# Patient Record
Sex: Female | Born: 1975 | Race: Black or African American | Hispanic: No | Marital: Single | Smoking: Never smoker
Health system: Southern US, Community
[De-identification: ages and names within clinical notes are randomized; demographics above are authoritative.]

## PROBLEM LIST (undated history)

## (undated) DIAGNOSIS — E538 Deficiency of other specified B group vitamins: Secondary | ICD-10-CM

## (undated) DIAGNOSIS — F419 Anxiety disorder, unspecified: Secondary | ICD-10-CM

## (undated) HISTORY — PX: DILATION AND CURETTAGE OF UTERUS: SHX78

## (undated) HISTORY — PX: DIAGNOSTIC LAPAROSCOPY: SUR761

## (undated) HISTORY — PX: INCISE AND DRAIN ABCESS: PRO64

## (undated) HISTORY — PX: TUBAL LIGATION: SHX77

---

## 1998-09-07 ENCOUNTER — Other Ambulatory Visit: Admission: RE | Admit: 1998-09-07 | Discharge: 1998-09-07 | Payer: Self-pay | Admitting: Obstetrics

## 1998-12-12 ENCOUNTER — Emergency Department (HOSPITAL_COMMUNITY): Admission: EM | Admit: 1998-12-12 | Discharge: 1998-12-12 | Payer: Self-pay | Admitting: Emergency Medicine

## 1999-02-02 ENCOUNTER — Emergency Department (HOSPITAL_COMMUNITY): Admission: EM | Admit: 1999-02-02 | Discharge: 1999-02-02 | Payer: Self-pay | Admitting: Emergency Medicine

## 2001-02-10 ENCOUNTER — Other Ambulatory Visit: Admission: RE | Admit: 2001-02-10 | Discharge: 2001-02-10 | Payer: Self-pay | Admitting: *Deleted

## 2001-07-23 ENCOUNTER — Inpatient Hospital Stay (HOSPITAL_COMMUNITY): Admission: AD | Admit: 2001-07-23 | Discharge: 2001-07-23 | Payer: Self-pay | Admitting: *Deleted

## 2001-12-14 ENCOUNTER — Emergency Department (HOSPITAL_COMMUNITY): Admission: EM | Admit: 2001-12-14 | Discharge: 2001-12-15 | Payer: Self-pay | Admitting: Emergency Medicine

## 2003-01-12 ENCOUNTER — Emergency Department (HOSPITAL_COMMUNITY): Admission: EM | Admit: 2003-01-12 | Discharge: 2003-01-12 | Payer: Self-pay | Admitting: Emergency Medicine

## 2004-03-08 ENCOUNTER — Inpatient Hospital Stay (HOSPITAL_COMMUNITY): Admission: AD | Admit: 2004-03-08 | Discharge: 2004-03-10 | Payer: Self-pay | Admitting: *Deleted

## 2004-03-08 ENCOUNTER — Encounter (INDEPENDENT_AMBULATORY_CARE_PROVIDER_SITE_OTHER): Payer: Self-pay | Admitting: Specialist

## 2004-03-11 ENCOUNTER — Inpatient Hospital Stay (HOSPITAL_COMMUNITY): Admission: AD | Admit: 2004-03-11 | Discharge: 2004-03-11 | Payer: Self-pay | Admitting: Obstetrics

## 2004-03-14 ENCOUNTER — Emergency Department (HOSPITAL_COMMUNITY): Admission: EM | Admit: 2004-03-14 | Discharge: 2004-03-14 | Payer: Self-pay | Admitting: Emergency Medicine

## 2004-06-22 ENCOUNTER — Emergency Department (HOSPITAL_COMMUNITY): Admission: EM | Admit: 2004-06-22 | Discharge: 2004-06-22 | Payer: Self-pay | Admitting: Family Medicine

## 2004-07-24 ENCOUNTER — Other Ambulatory Visit: Admission: RE | Admit: 2004-07-24 | Discharge: 2004-07-24 | Payer: Self-pay | Admitting: Obstetrics and Gynecology

## 2004-08-09 ENCOUNTER — Ambulatory Visit (HOSPITAL_COMMUNITY): Admission: RE | Admit: 2004-08-09 | Discharge: 2004-08-09 | Payer: Self-pay | Admitting: Obstetrics and Gynecology

## 2004-11-26 ENCOUNTER — Emergency Department (HOSPITAL_COMMUNITY): Admission: EM | Admit: 2004-11-26 | Discharge: 2004-11-26 | Payer: Self-pay | Admitting: Emergency Medicine

## 2004-12-02 ENCOUNTER — Emergency Department (HOSPITAL_COMMUNITY): Admission: EM | Admit: 2004-12-02 | Discharge: 2004-12-02 | Payer: Self-pay | Admitting: *Deleted

## 2005-01-08 ENCOUNTER — Emergency Department (HOSPITAL_COMMUNITY): Admission: EM | Admit: 2005-01-08 | Discharge: 2005-01-08 | Payer: Self-pay | Admitting: Emergency Medicine

## 2005-06-08 ENCOUNTER — Emergency Department (HOSPITAL_COMMUNITY): Admission: EM | Admit: 2005-06-08 | Discharge: 2005-06-08 | Payer: Self-pay | Admitting: Emergency Medicine

## 2005-09-07 ENCOUNTER — Emergency Department (HOSPITAL_COMMUNITY): Admission: EM | Admit: 2005-09-07 | Discharge: 2005-09-07 | Payer: Self-pay | Admitting: Emergency Medicine

## 2006-03-27 ENCOUNTER — Other Ambulatory Visit: Admission: RE | Admit: 2006-03-27 | Discharge: 2006-03-27 | Payer: Self-pay | Admitting: Obstetrics and Gynecology

## 2006-07-22 ENCOUNTER — Ambulatory Visit: Payer: Self-pay | Admitting: Internal Medicine

## 2007-08-26 ENCOUNTER — Encounter: Payer: Self-pay | Admitting: Internal Medicine

## 2007-10-16 ENCOUNTER — Inpatient Hospital Stay (HOSPITAL_COMMUNITY): Admission: AD | Admit: 2007-10-16 | Discharge: 2007-10-16 | Payer: Self-pay | Admitting: Obstetrics and Gynecology

## 2007-10-20 ENCOUNTER — Inpatient Hospital Stay (HOSPITAL_COMMUNITY): Admission: AD | Admit: 2007-10-20 | Discharge: 2007-10-20 | Payer: Self-pay | Admitting: Obstetrics and Gynecology

## 2007-10-22 ENCOUNTER — Inpatient Hospital Stay (HOSPITAL_COMMUNITY): Admission: AD | Admit: 2007-10-22 | Discharge: 2007-10-24 | Payer: Self-pay | Admitting: Obstetrics and Gynecology

## 2007-11-01 ENCOUNTER — Inpatient Hospital Stay (HOSPITAL_COMMUNITY): Admission: AD | Admit: 2007-11-01 | Discharge: 2007-11-01 | Payer: Self-pay | Admitting: Obstetrics and Gynecology

## 2007-11-17 ENCOUNTER — Inpatient Hospital Stay (HOSPITAL_COMMUNITY): Admission: AD | Admit: 2007-11-17 | Discharge: 2007-11-18 | Payer: Self-pay | Admitting: Obstetrics and Gynecology

## 2007-11-22 ENCOUNTER — Inpatient Hospital Stay (HOSPITAL_COMMUNITY): Admission: AD | Admit: 2007-11-22 | Discharge: 2007-11-22 | Payer: Self-pay | Admitting: Obstetrics and Gynecology

## 2007-12-14 ENCOUNTER — Inpatient Hospital Stay (HOSPITAL_COMMUNITY): Admission: AD | Admit: 2007-12-14 | Discharge: 2007-12-14 | Payer: Self-pay | Admitting: Obstetrics and Gynecology

## 2007-12-20 ENCOUNTER — Inpatient Hospital Stay (HOSPITAL_COMMUNITY): Admission: RE | Admit: 2007-12-20 | Discharge: 2007-12-23 | Payer: Self-pay | Admitting: Obstetrics and Gynecology

## 2008-06-10 ENCOUNTER — Emergency Department (HOSPITAL_COMMUNITY): Admission: EM | Admit: 2008-06-10 | Discharge: 2008-06-10 | Payer: Self-pay | Admitting: Emergency Medicine

## 2008-07-04 ENCOUNTER — Emergency Department (HOSPITAL_COMMUNITY): Admission: EM | Admit: 2008-07-04 | Discharge: 2008-07-04 | Payer: Self-pay | Admitting: Emergency Medicine

## 2008-12-14 ENCOUNTER — Ambulatory Visit: Payer: Self-pay | Admitting: Internal Medicine

## 2008-12-14 DIAGNOSIS — R300 Dysuria: Secondary | ICD-10-CM

## 2008-12-14 DIAGNOSIS — N760 Acute vaginitis: Secondary | ICD-10-CM

## 2008-12-14 HISTORY — DX: Acute vaginitis: N76.0

## 2008-12-14 HISTORY — DX: Dysuria: R30.0

## 2008-12-14 LAB — CONVERTED CEMR LAB
Bilirubin Urine: NEGATIVE
Chlamydia, DNA Probe: NEGATIVE
Crystals: NEGATIVE
GC Probe Amp, Genital: NEGATIVE
HCV Ab: NEGATIVE
Hep A IgM: NEGATIVE
Hep B C IgM: NEGATIVE
Hepatitis B Surface Ag: NEGATIVE
Ketones, ur: NEGATIVE mg/dL
Nitrite: NEGATIVE
Specific Gravity, Urine: 1.025 (ref 1.000–1.03)
Total Protein, Urine: NEGATIVE mg/dL
Urine Glucose: NEGATIVE mg/dL
Urobilinogen, UA: 0.2 (ref 0.0–1.0)
pH: 6 (ref 5.0–8.0)

## 2008-12-26 ENCOUNTER — Emergency Department (HOSPITAL_COMMUNITY): Admission: EM | Admit: 2008-12-26 | Discharge: 2008-12-26 | Payer: Self-pay | Admitting: Emergency Medicine

## 2009-03-07 ENCOUNTER — Telehealth: Payer: Self-pay | Admitting: Internal Medicine

## 2009-03-22 ENCOUNTER — Encounter: Payer: Self-pay | Admitting: Internal Medicine

## 2009-03-23 ENCOUNTER — Encounter: Payer: Self-pay | Admitting: Internal Medicine

## 2009-04-10 ENCOUNTER — Telehealth: Payer: Self-pay | Admitting: Internal Medicine

## 2009-04-24 ENCOUNTER — Ambulatory Visit: Payer: Self-pay | Admitting: Diagnostic Radiology

## 2009-04-24 ENCOUNTER — Emergency Department (HOSPITAL_BASED_OUTPATIENT_CLINIC_OR_DEPARTMENT_OTHER): Admission: EM | Admit: 2009-04-24 | Discharge: 2009-04-24 | Payer: Self-pay | Admitting: Emergency Medicine

## 2010-01-11 ENCOUNTER — Ambulatory Visit: Payer: Self-pay | Admitting: Internal Medicine

## 2010-01-11 DIAGNOSIS — R4586 Emotional lability: Secondary | ICD-10-CM | POA: Insufficient documentation

## 2010-01-11 DIAGNOSIS — F411 Generalized anxiety disorder: Secondary | ICD-10-CM | POA: Insufficient documentation

## 2010-01-11 DIAGNOSIS — J069 Acute upper respiratory infection, unspecified: Secondary | ICD-10-CM

## 2010-01-11 HISTORY — DX: Acute upper respiratory infection, unspecified: J06.9

## 2010-01-11 HISTORY — DX: Emotional lability: R45.86

## 2010-03-01 ENCOUNTER — Ambulatory Visit: Payer: Self-pay | Admitting: Internal Medicine

## 2010-03-01 DIAGNOSIS — K5909 Other constipation: Secondary | ICD-10-CM

## 2010-03-01 DIAGNOSIS — K921 Melena: Secondary | ICD-10-CM | POA: Insufficient documentation

## 2010-03-01 HISTORY — DX: Other constipation: K59.09

## 2010-03-01 HISTORY — DX: Melena: K92.1

## 2010-03-11 ENCOUNTER — Telehealth: Payer: Self-pay | Admitting: Internal Medicine

## 2010-10-22 ENCOUNTER — Emergency Department (HOSPITAL_BASED_OUTPATIENT_CLINIC_OR_DEPARTMENT_OTHER): Admission: EM | Admit: 2010-10-22 | Discharge: 2010-10-22 | Payer: Self-pay | Admitting: Emergency Medicine

## 2010-10-24 ENCOUNTER — Emergency Department (HOSPITAL_COMMUNITY): Admission: EM | Admit: 2010-10-24 | Discharge: 2010-10-24 | Payer: Self-pay | Admitting: Emergency Medicine

## 2010-10-28 ENCOUNTER — Telehealth: Payer: Self-pay | Admitting: Internal Medicine

## 2011-01-07 NOTE — Assessment & Plan Note (Signed)
Summary: blood in stool x1wk/offered sooner appt/pt requested certain ...   Vital Signs:  Patient profile:   35 year old female Weight:      156 pounds Temp:     98.2 degrees F oral Pulse rate:   75 / minute BP sitting:   102 / 64  (left arm)  Vitals Entered By: Tora Perches (March 01, 2010 4:12 PM) CC: blood in stool Is Patient Diabetic? No   CC:  blood in stool.  History of Present Illness: C/o conspation C/o bleeding x episode after a hard stool  Preventive Screening-Counseling & Management  Alcohol-Tobacco     Smoking Status: never  Current Medications (verified): 1)  Birth Control Pills 2)  Xanax 0.5 Mg Tabs (Alprazolam) .... 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    Physical Exam  General:  NAD Mouth:  WNL Lungs:  CTA Heart:  RRR Abdomen:  soft and non-tender.   Rectal:  No external abnormalities noted. Normal sphincter tone. No rectal masses or tenderness.   Impression & Recommendations:  Problem # 1:  HEMATOCHEZIA (ICD-578.1) Assessment New Procedure: Anoscopy Indication: Rectal bleeding Risks and benefits were explained. The pt. was placed in the L decubitus position. Digital rectal exam revealed no masses. Anoscope was introduced w/o difficulties. Upon withdrawl, a carefull look at the mucosa was obtained. At 6 o'clock a    6    mm int. inflamed hemorroid was present without active bleeding. Impression: No anal fissure. Inflamed internal hemorrhoid. Disposition: see A&P.  Tolerated well. Complications: none.  Problem # 2:  CONSTIPATION, CHRONIC (ICD-564.09) Assessment: New  See "Patient Instructions".  Her updated medication list for  this problem includes:    Miralax Powd (Polyethylene glycol 3350) .Marland Kitchen... 1 scoop once daily as needed for constipation  Orders: Anoscopy (60454)  Complete Medication List: 1)  Birth Control Pills  2)  Xanax 0.5 Mg Tabs (Alprazolam) .... Once daily 3)  Loratadine-pseudoephedrine 10-240 Mg Xr24h-tab (Loratadine-pseudoephedrine) .Marland Kitchen.. 1 by mouth once daily as needed allergies 4)  Optivar 0.05 % Soln (Azelastine hcl) .Marland Kitchen.. 1 gtt each eye two times a day for allergies 5)  Amoxicillin 500 Mg Caps (Amoxicillin) .... 2 caps by mouth bid 6)  Anusol-hc 25 Mg Supp (Hydrocortisone acetate) .Marland Kitchen.. 1 pr two times a day for hemorrhoids 7)  Vitamin D 1000 Unit Tabs (Cholecalciferol) .Marland Kitchen.. 1 by mouth qd 8)  Miralax Powd (Polyethylene glycol 3350) .Marland Kitchen.. 1 scoop once daily as needed for constipation  Patient Instructions: 1)  Use Metamucil 2)  Try to eat more raw plant food, fresh and dry fruit, raw almonds, leafy vegetables, whole foods and less red meat, less animal fat. Poultry and fish is better for you than pork and beef. Avoid processed foods (canned soups, hot dogs, sausage, bacon , frozen dinners). Avoid corn syrup, high fructose syrup or aspartam and Splenda  containing drinks. Honey, Agave and Stevia are better sweeteners. Make your own  dressing with olive oil, wine vinegar, lemon juce, garlic etc. for your salads. Prescriptions: ANUSOL-HC 25 MG SUPP (HYDROCORTISONE ACETATE) 1 pr two times a day for hemorrhoids  #20 x 3   Entered and Authorized by:   Tresa Garter MD   Signed  by:   Tresa Garter MD on 03/01/2010   Method used:   Print then Give to Patient   RxID:   [id number] MIRALAX  POWD (POLYETHYLENE GLYCOL 3350) 1 scoop once daily as needed for constipation  #1 x 12   Entered and Authorized by:   Tresa Garter MD   Signed by:   Tresa Garter MD on 03/01/2010   Method used:   Print then Give to Patient   RxID:   [id number] AMOXICILLIN 500 MG CAPS (AMOXICILLIN) 2  caps by mouth bid  #40 x 0   Entered and Authorized by:   Tresa Garter MD   Signed by:   Tresa Garter MD on 03/01/2010   Method used:   Print then Give to Patient   RxID:   [id number] OPTIVAR 0.05 % SOLN (AZELASTINE HCL) 1 gtt each eye two times a day for allergies  #1 x 6   Entered and Authorized by:   Tresa Garter MD   Signed by:   Tresa Garter MD on 03/01/2010   Method used:   Print then Give to Patient   RxID:   [id number] LORATADINE-PSEUDOEPHEDRINE 10-240 MG XR24H-TAB (LORATADINE-PSEUDOEPHEDRINE) 1 by mouth once daily as needed allergies  #30 x 12   Entered and Authorized by:   Tresa Garter MD   Signed by:   Tresa Garter MD on 03/01/2010   Method used:   Print then Give to Patient   RxID:   [id number]

## 2011-01-07 NOTE — Assessment & Plan Note (Signed)
Summary: BODY ACHES--COUGH---STC   Vital Signs:  Patient profile:   35 year old female Height:      63 inches Weight:      157 pounds BMI:     27.91 Temp:     99.6 degrees F oral Pulse rate:   93 / minute BP sitting:   96 / 60  (left arm)  Vitals Entered By: Tora Perches (January 11, 2010 9:31 AM) CC: cough and bodyaches Is Patient Diabetic? No   CC:  cough and bodyaches.  History of Present Illness: The patient presents with complaints of sore throat, fever, cough, sinus congestion and drainge of several days duration. Not better with OTC meds. Chest hurts with coughing. Can't sleep due to cough. Muscle aches are present.  The mucus is  not colored.   Preventive Screening-Counseling & Management  Alcohol-Tobacco     Smoking Status: never  Current Medications (verified): 1)  Birth Control Pills 2)  Xanax 0.5 Mg Tabs (Alprazolam) .... 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    Lungs:  CTA Heart:  RRR Abdomen:  soft and non-tender.   Skin:  Intact without suspicious lesions or rashes Psych:  Oriented X3.     Impression & Recommendations:  Problem # 1:  UPPER RESPIRATORY INFECTION, ACUTE (ICD-465.9) Assessment New Zpac if worse The following medications were removed from the medication list:    Fexofenadine Hcl 180 Mg Tabs (Fexofenadine hcl) .Marland Kitchen... 1 once daily as needed allergies Her updated medication list for this problem includes:    Promethazine-codeine 6.25-10 Mg/67ml Syrp (Promethazine-codeine) .Marland Kitchen... 5-10 ml by mouth q id as needed cough    Tessalon Perles 100 Mg Caps (Benzonatate) .Marland Kitchen...  1-2 by mouth two times a day as needed cogh  Complete Medication List: 1)  Birth Control Pills  2)  Xanax 0.5 Mg Tabs (Alprazolam) .... Once daily 3)  Zithromax Z-pak 250 Mg Tabs (Azithromycin) .... As dirrected 4)  Promethazine-codeine 6.25-10 Mg/34ml Syrp (Promethazine-codeine) .... 5-10 ml by mouth q id as needed cough 5)  Diflucan 150 Mg Tabs (Fluconazole) .Marland Kitchen.. 1 by mouth once daily once for yeast infection 6)  Tessalon Perles 100 Mg Caps (Benzonatate) .Marland Kitchen.. 1-2 by mouth two times a day as needed cogh  Patient Instructions: 1)  Use over-the-counter medicines for "cold": Tylenol  650mg  or Advil 400mg  every 6 hours  for fever; Delsym or Robutussin for cough. Mucinex or Mucinex D for congestion. Ricola or Halls for sore throat. Office visit if not better or if worse.  Prescriptions: ZITHROMAX Z-PAK 250 MG TABS (AZITHROMYCIN) as dirrected  #1 x 0   Entered and Authorized by:   Tresa Garter MD   Signed by:   Tresa Garter MD on 01/11/2010   Method used:   Print then Give to Patient   RxID:   [id number] TESSALON PERLES 100 MG CAPS (BENZONATATE) 1-2 by mouth two times a day as needed cogh  #120 x 1   Entered and Authorized by:   Tresa Garter MD   Signed by:   Tresa Garter MD on 01/11/2010   Method used:   Print then Give to Patient  RxID:   [id number] DIFLUCAN 150 MG TABS (FLUCONAZOLE) 1 by mouth once daily once for yeast infection  #3 x 1   Entered and Authorized by:   Tresa Garter MD   Signed by:   Tresa Garter MD on 01/11/2010   Method used:   Print then Give to Patient   RxID:   [id number] PROMETHAZINE-CODEINE 6.25-10 MG/5ML SYRP (PROMETHAZINE-CODEINE) 5-10 ml by mouth q id as needed cough  #300 ml x 0   Entered and Authorized by:   Tresa Garter MD   Signed by:   Tresa Garter MD on 01/11/2010   Method used:   Print then Give to Patient   RxID:   [id number] ZITHROMAX Z-PAK 250 MG TABS (AZITHROMYCIN) as  dirrected  #1 x 0   Entered and Authorized by:   Tresa Garter MD   Signed by:   Tresa Garter MD on 01/11/2010   Method used:   Electronically to        [id number] (retail)       [address]       [place], [place]  [address]       Ph: [phone] or [phone]       Fax: [phone]   RxID:   [id number]

## 2011-01-07 NOTE — Progress Notes (Signed)
Summary: ER - Req rx  Phone Note Call from Patient Call back at Home Phone [phone]   Summary of Call: Pt was seen at the ER and given ibuprofen, valium, & ultram for back pain/spasms. Valium caused nausea & dizzyness. Meds are not helping at all w/pain. Patient is requesting alt rx from Dr Posey Rea.  Initial call taken by: Lamar Sprinkles, CMA,  October 28, 2010 8:56 AM  Follow-up for Phone Call        Cont current meds  Try Flexeril OV if not better Follow-up by: Tresa Garter MD,  October 28, 2010 1:17 PM  Additional Follow-up for Phone Call Additional follow up Details #1::        Pt informed  Additional Follow-up by: Lamar Sprinkles, CMA,  October 28, 2010 1:45 PM    New/Updated Medications: CYCLOBENZAPRINE HCL 10 MG TABS (CYCLOBENZAPRINE HCL) 1/2-1 tab by mouth two times a day as needed muscle spasms Prescriptions: CYCLOBENZAPRINE HCL 10 MG TABS (CYCLOBENZAPRINE HCL) 1/2-1 tab by mouth two times a day as needed muscle spasms  #30 x 0   Entered and Authorized by:   Tresa Garter MD   Signed by:   Lamar Sprinkles, CMA on 10/28/2010   Method used:   Electronically to        [id number] (retail)       [address]       [place], [place]  [address]       Ph: [phone] or [phone]       Fax: [phone]   RxID:   [id number]

## 2011-01-07 NOTE — Progress Notes (Signed)
Summary: YEAST INF  Phone Note Call from Patient Call back at Home Phone [phone]   Summary of Call: Patient is requesting rx for vaginal yeast infection, [facility]  Initial call taken by: Lamar Sprinkles, CMA,  March 11, 2010 9:16 AM  Follow-up for Phone Call        OK Diflucan 150  Follow-up by: Tresa Garter MD,  March 11, 2010 4:58 PM  Additional Follow-up for Phone Call Additional follow up Details #1::        Left vm for pt to check w/her pharm  Additional Follow-up by: Lamar Sprinkles, CMA,  March 11, 2010 6:02 PM    New/Updated Medications: DIFLUCAN 150 MG TABS (FLUCONAZOLE) 1 by mouth once daily once for yeast infection Prescriptions: DIFLUCAN 150 MG TABS (FLUCONAZOLE) 1 by mouth once daily once for yeast infection  #1 x 1   Entered and Authorized by:   Tresa Garter MD   Signed by:   Lamar Sprinkles, CMA on 03/11/2010   Method used:   Electronically to        [id number] (retail)       [address]       [place], [place]  [address]       Ph: [phone] or [phone]       Fax: [phone]   RxID:   [id number]

## 2011-02-18 LAB — URINALYSIS, ROUTINE W REFLEX MICROSCOPIC
Bilirubin Urine: NEGATIVE
Bilirubin Urine: NEGATIVE
Glucose, UA: NEGATIVE mg/dL
Glucose, UA: NEGATIVE mg/dL
Hgb urine dipstick: NEGATIVE
Hgb urine dipstick: NEGATIVE
Ketones, ur: NEGATIVE mg/dL
Ketones, ur: NEGATIVE mg/dL
Nitrite: NEGATIVE
Nitrite: NEGATIVE
Protein, ur: NEGATIVE mg/dL
Protein, ur: NEGATIVE mg/dL
Specific Gravity, Urine: 1.022 (ref 1.005–1.030)
Specific Gravity, Urine: 1.03 (ref 1.005–1.030)
Urobilinogen, UA: 1 mg/dL (ref 0.0–1.0)
Urobilinogen, UA: 1 mg/dL (ref 0.0–1.0)
pH: 6.5 (ref 5.0–8.0)
pH: 6.5 (ref 5.0–8.0)

## 2011-02-18 LAB — WET PREP, GENITAL
Trich, Wet Prep: NONE SEEN
Yeast Wet Prep HPF POC: NONE SEEN

## 2011-02-18 LAB — URINE MICROSCOPIC-ADD ON

## 2011-02-18 LAB — GLUCOSE, CAPILLARY: Glucose-Capillary: 79 mg/dL (ref 70–99)

## 2011-02-18 LAB — PREGNANCY, URINE
Preg Test, Ur: NEGATIVE
Preg Test, Ur: NEGATIVE

## 2011-02-18 LAB — GC/CHLAMYDIA PROBE AMP, GENITAL
Chlamydia, DNA Probe: NEGATIVE
GC Probe Amp, Genital: NEGATIVE

## 2011-04-14 ENCOUNTER — Emergency Department (HOSPITAL_BASED_OUTPATIENT_CLINIC_OR_DEPARTMENT_OTHER)
Admission: EM | Admit: 2011-04-14 | Discharge: 2011-04-14 | Disposition: A | Discharge status: Home / Self Care | Payer: BC Managed Care – PPO | Attending: Emergency Medicine | Admitting: Emergency Medicine

## 2011-04-14 ENCOUNTER — Emergency Department (INDEPENDENT_AMBULATORY_CARE_PROVIDER_SITE_OTHER): Payer: BC Managed Care – PPO

## 2011-04-14 DIAGNOSIS — N12 Tubulo-interstitial nephritis, not specified as acute or chronic: Secondary | ICD-10-CM | POA: Insufficient documentation

## 2011-04-14 DIAGNOSIS — R109 Unspecified abdominal pain: Secondary | ICD-10-CM | POA: Insufficient documentation

## 2011-04-14 DIAGNOSIS — R05 Cough: Secondary | ICD-10-CM

## 2011-04-14 DIAGNOSIS — R509 Fever, unspecified: Secondary | ICD-10-CM

## 2011-04-14 LAB — BASIC METABOLIC PANEL
BUN: 7 mg/dL (ref 6–23)
CO2: 23 mEq/L (ref 19–32)
Calcium: 9.4 mg/dL (ref 8.4–10.5)
Chloride: 100 mEq/L (ref 96–112)
Creatinine, Ser: 0.6 mg/dL (ref 0.4–1.2)
GFR calc Af Amer: >60 mL/min (ref >60)
Sodium: 135 mEq/L (ref 135–145)

## 2011-04-14 LAB — URINE MICROSCOPIC-ADD ON

## 2011-04-14 LAB — URINALYSIS, ROUTINE W REFLEX MICROSCOPIC
Bilirubin Urine: NEGATIVE
Glucose, UA: NEGATIVE mg/dL
Ketones, ur: 40 mg/dL — AB
Nitrite: POSITIVE — AB
Specific Gravity, Urine: 1.017 (ref 1.005–1.030)
Urobilinogen, UA: 0.2 mg/dL (ref 0.0–1.0)
pH: 5.5 (ref 5.0–8.0)

## 2011-04-14 LAB — PREGNANCY, URINE: Preg Test, Ur: NEGATIVE

## 2011-04-22 NOTE — H&P (Signed)
[name], [name]                ACCOUNT NO.:  [id number]   MEDICAL RECORD NO.:  [id number]          PATIENT TYPE:  INP   LOCATION:  9156                          [facility]ILITY:  [facility]   PHYSICIAN:  Dineen Kid. Rana Snare, M.D.    DATE OF BIRTH:  26-Jun-1976   DATE OF ADMISSION:  10/22/2007  DATE OF DISCHARGE:                              HISTORY & PHYSICAL   NOTE:  She is being admitted was hospital antepartum area.   HISTORY OF PRESENT ILLNESS:  Meghan [name] presenting today the office for  evaluation of preterm labor.  She is a 35 year old G3, P1, A1 with a  history of previous cesarean section at term but she does have a history  of preterm labor beginning at 100 weeks' size pregnancy.  She has been  seen several times on this pregnancy, most recently on November 10 had a  positive fetal fibronectin.  She is currently on bedrest and terbutaline  and has been in the triage unit several times for evaluation of preterm  labor.  Ultrasound evaluation on 11/10 also showed a cervical length of  2.1 cm with no funneling.   OBJECTIVE:  On physical exam today her blood pressure is 100/60. Funnel  height is 29 cm.  The cervix is closed, 2 cm long and high.  Fetal heart  rate is in the 150s.   IMPRESSION AND PLAN:  Intrauterine pregnancy at 29-3/7 weeks with a  positive fetal fibronectin and preterm contractions, currently on  terbutaline.  Recommend hospitalization for further monitoring of  preterm labor, institution of steroids for fetal lung maturity.  We will  tocolyse as needed.  Otherwise will continue bedrest after discharge.      Dineen Kid Rana Snare, M.D.  Electronically Signed     DCL/MEDQ  D:  10/22/2007  T:  10/23/2007  Job:  [id number]

## 2011-04-22 NOTE — Op Note (Signed)
[name], [name]                ACCOUNT NO.:  [id number]   MEDICAL RECORD NO.:  [id number]          PATIENT TYPE:  INP   LOCATION:  9121                          [facility]   PHYSICIAN:  Michelle L. Grewal, M.D.DATE OF BIRTH:  [birth year]   DATE OF PROCEDURE:  12/20/2007  DATE OF DISCHARGE:                               OPERATIVE REPORT   PREOPERATIVE DIAGNOSIS:  Intrauterine pregnancy at 39 weeks, previous C-  section, declines vaginal birth after cesarean section.   POSTOPERATIVE DIAGNOSIS:  Intrauterine pregnancy at 39 weeks, previous C-  section, declines vaginal birth after cesarean section.   PROCEDURE:  Repeat low transverse cesarean section.   SURGEON:  Dr. Vincente Poli.   ANESTHESIA:  Spinal.   FINDINGS:  Female infant, Apgars 9 at one minute, 9 at five minutes in  cephalic presentation.   PATHOLOGY:  None.   ESTIMATED BLOOD LOSS:  600 mL.   FLUIDS:  1900 mL.   URINE OUTPUT:  300 mL.   COMPLICATIONS:  None.   PROCEDURE:  Patient taken to the operating room where spinal was placed  by Dr. Arby Barrette without difficulty.  She was then prepped and draped and  a Foley catheter was inserted.  A low transverse incision was made at  the area of her previous scar.  She had significant scarring in her  subcutaneous layer.  This was carried down to the fascia.  Fascia scored  in the midline and extended laterally.  The rectus muscles were  separated in midline.  The peritoneum was entered bluntly.  The  peritoneal incision was then stretched.  The bladder blade was inserted,  the lower uterine segment was identified.  The bladder flap was created  sharply and then digitally and then bladder blade was readjusted.  A low  transverse incision was made in the uterus.  The uterus was entered  using a hemostat.  Amniotic fluid was clear.  The baby was in cephalic  presentation.  The baby was delivered easily with one gentle pull of the  vacuum.  The baby was a female infant  Apgars 9 at one minute and 9 at  five minutes.  The cord was clamped and cut the baby was handed to the  waiting pediatrician.  The cord blood was obtained.  Pitocin and  antibiotics were given.  The uterus was exteriorized and cleaned of all  clots and debris.  The uterine incision was closed in one layer using 0-  0 chromic in a running locked stitch.  Hemostasis was excellent.  Irrigation was performed.  Hemostasis was again noted.  The peritoneum  and rectus muscles were reapproximated using 0-0 Vicryl.  The fascia was  closed  using 0-0 Vicryl in a running stitch x2 starting at each corner and  meeting in the midline using a continuous running stitch.  After  irrigation of subcutaneous layer, the skin was closed with staples.  All  sponge, lap and instrument counts were correct x2.  The patient went to  recovery room in stable condition.      Michelle L. Vincente Poli, M.D.  Electronically Signed  MLG/MEDQ  D:  12/20/2007  T:  12/20/2007  Job:  [id number]

## 2011-04-22 NOTE — Discharge Summary (Signed)
NAMEGAYNEL, SCHAAFSMA                ACCOUNT NO.:  [id number]   MEDICAL RECORD NO.:  [id number]          PATIENT TYPE:  INP   LOCATION:  9121                          [facility]   PHYSICIAN:  Dineen Kid. Rana Snare, M.D.    DATE OF BIRTH:  Mar 27, 1976   DATE OF ADMISSION:  12/20/2007  DATE OF DISCHARGE:  12/23/2007                               DISCHARGE SUMMARY   ADMITTING DIAGNOSES:  1. Intrauterine pregnancy at 84 weeks estimated gestational age.  2. Previous cesarean section, desires repeat.   DISCHARGE DIAGNOSES:  1. Status post low transverse cesarean section.  2. Viable female infant.   PROCEDURE:  Repeat low transverse cesarean section.   REASON FOR ADMISSION:  Please see dictated H&P.   HOSPITAL COURSE:  The patient was a 35 year old gravida 2, para 1, who  presented to the hospital for scheduled cesarean section.  On the  morning of admission the patient was taken to the operating room where  spinal anesthesia was administered without difficulty.  A low transverse  incision was made with delivery of a viable female infant weighing 7  pounds 5 ounces.  Apgars were 9 at one minute, 9 at 5 minutes.  Patient  tolerated the procedure well and was taken to the recovery room in  stable condition.  On postoperative day #1 patient was without  complaints, vital signs were stable, she was afebrile, fundus firm and  nontender.  Abdominal dressing was noted to be clean, dry and intact.  Laboratory findings revealed a hemoglobin of 10.0.  On postoperative day  #2 patient was without complaint, vital signs were stable, she was  afebrile, abdomen was soft, fundus firm and nontender, incision was  clean, dry and intact.  Patient was ambulating well, tolerating a  regular diet, with no complaints of nausea or vomiting.  On  postoperative day #3 patient was without complaint, vital signs were  stable, she was afebrile, fundus firm and nontender, incision was clean,  dry and intact.  Staples  were removed and the patient was later  discharged home.   CONDITION ON DISCHARGE:  Good.   DIET:  Regular as tolerated.   ACTIVITY:  No heavy lifting.  No driving x3 weeks.  No vaginal entry.   FOLLOWUP:  Patient to follow up in the office in 1-2 weeks for an  incision check.  She is to call for temperature greater than 100  degrees, persistent nausea/vomiting, heavy vaginal bleeding, and any  redness or drainage from the incisional site.   DISCHARGE MEDICATIONS:  Percocet 5/325 #30 one p.o. every 4-6 hours  p.r.n., Motrin 600 mg every six hours, prenatal vitamins one p.o. daily,  Colace one p.o. daily p.r.n.      Julio Sicks, N.P.      Dineen Kid Rana Snare, M.D.  Electronically Signed    CC/MEDQ  D:  12/23/2007  T:  12/23/2007  Job:  [id number]

## 2011-04-25 NOTE — Op Note (Signed)
Meghan [name], CROSBY                          ACCOUNT NO.:  [id number]   MEDICAL RECORD NO.:  [id number]                   PATIENT TYPE:  AMB   LOCATION:  [facility]                                 [facility]   PHYSICIAN:  Kathreen Cosier, M.D.           DATE OF BIRTH:  [birth year]   DATE OF PROCEDURE:  03/08/2004  DATE OF DISCHARGE:                                 OPERATIVE REPORT   PREOPERATIVE DIAGNOSIS:  Ruptured ectopic pregnancy.   POSTOPERATIVE DIAGNOSIS:  Ruptured ectopic pregnancy.   Under general anesthesia, patient in supine position, abdomen prepped and  draped.  Bladder emptied with a straight Foley catheter.  A transverse  suprapubic incision made through the old scar and carried down to the rectus  fascia.  The fascia cleaned and incised the length of the incision, recti  muscles retracted laterally, peritoneum was incised longitudinally.  The  left tube and ovary were normal.  The right ovary was normal.  There was a  large clot and in the right tube was a ruptured right ampullary ectopic  pregnancy.  Kelly clamps were placed across the base of the mesosalpinx and  the partial right salpingectomy was performed.  Hemostasis was achieved with  0 chromic sutures.  Blood loss was approximately 100-200 mL.  Lap and sponge  counts correct.  Abdomen closed in layers, peritoneum with a continuous  suture of 0 chromic, the fascia with continuous suture of 0 Dexon, skin  closed with subcuticular stitch of 3-0 Monocryl.                                               Kathreen Cosier, M.D.    BAM/MEDQ  D:  03/08/2004  T:  03/09/2004  Job:  [id number]

## 2011-04-25 NOTE — Discharge Summary (Signed)
NAMEMADELEINE, FENN                          ACCOUNT NO.:  [id number]   MEDICAL RECORD NO.:  [id number]                   PATIENT TYPE:  INP   LOCATION:  9309                                 [facility]   PHYSICIAN:  Kathreen Cosier, M.D.           DATE OF BIRTH:  04-20-1976   DATE OF ADMISSION:  03/08/2004  DATE OF DISCHARGE:  03/10/2004                                 DISCHARGE SUMMARY   The patient is a 35 year old female who was admitted on March 08, 2004 with a  ruptured ectopic pregnancy.  She had a right ruptured ectopic pregnancy with  a partial salpingectomy performed, ovaries were normal, left tube was  normal.  On admission hemoglobin 13.6; postop 11.4.  She did well and was  discharged on the second postoperative day on Tylox one to two every 3-4  hours and ampicillin 500 every 6 hours for 5 days to see me in 2 weeks.   DISCHARGE DIAGNOSIS:  Status post partial right salpingectomy for a ruptured  ectopic pregnancy.                                               Kathreen Cosier, M.D.    BAM/MEDQ  D:  03/10/2004  T:  03/11/2004  Job:  [id number]

## 2011-04-25 NOTE — Assessment & Plan Note (Signed)
[facility]                             PRIMARY CARE OFFICE NOTE   NAME:Spruce, BRIARROSE SHOR                       MRN:          [id number]  DATE:07/22/2006                            DOB:          October 08, 1976    The patient is a 35 year old female who presents to establish primary.  Complains of not being able to sleep for two years.  Incidentally she thinks  it started after her surgery on her right ovary.  Also complaining of  constipation that has been chronic. No blood in stool.   PAST MEDICAL HISTORY:  Unremarkable.   PAST SURGICAL HISTORY:  Right ovarian/tubal removal due to ectopic  pregnancy.   ALLERGIES:  None.   CURRENT MEDICATIONS:  Birth control pills.   SOCIAL HISTORY:  She works for customer service first shift.  She is single.  She has a 75 year old son.  Does smoke.  Occasional alcohol.   FAMILY HISTORY:  Positive for hypertension, diabetes, lung cancer in  grandparents.   REVIEW OF SYSTEMS:  Regular gyn care Dr. Vincente Poli.  No weight loss or weight  gain.  No syncope, no neurologic complaint. The rest is negative.   PHYSICAL EXAMINATION:  GENERAL:  She looks well.  She is in no acute  distress.  HEENT:  Moist mucosa.  NECK:  Supple, no thyromegaly or bruit.  LUNGS:  Clear.  No wheeze or rales.  HEART:  Sounds clear, no murmur, no gallop.  ABDOMEN:  Soft, nontender, without organomegaly or masses felt.  EXTREMITIES:  Lower extremities without edema.  NEUROLOGIC:  She is alert, oriented and appropriate.  Denies being  depressed.   There is a small wart under left mouth angle measuring about 4 mm.   ASSESSMENT AND PLAN:  1. Insomnia.  Chronic.  Sleep hygiene discussed.  Try Ambien 10 mg 1/2 to      1 at h.s. as needed, #30 with six refills.  Follow up with me in a      couple of months.  2. Constipation.  MiraLax 17 g as needed.  Follow-up in two months, obtain      lab work including TSH.  3. Wart on the face, desires  removal.  She will think about it.  4. Health maintenance.  Will obtain lab work appropriate for age.                                   Sonda Primes, MD   AP/MedQ  DD:  07/24/2006  DT:  07/24/2006  Job #:  [id number]

## 2011-04-25 NOTE — Op Note (Signed)
[name], Meghan [name]                          ACCOUNT NO.:  [id number]   MEDICAL RECORD NO.:  [id number]                   PATIENT TYPE:  AMB   LOCATION:  SDC                                  FACILITY:  [facility]   PHYSICIAN:  Meghan [name], M.D.            DATE OF BIRTH:  11-23-1976   DATE OF PROCEDURE:  08/09/2004  DATE OF DISCHARGE:                                 OPERATIVE REPORT   PREOPERATIVE DIAGNOSES:  Dysmenorrhea.   POSTOPERATIVE DIAGNOSES:  Dysmenorrhea.   PROCEDURE:  Diagnostic laparoscopy, lysis of adhesions and chromopertubation   ANESTHESIA:  General.   ESTIMATED BLOOD LOSS:  None.   SURGEON:  Meghan [name], M.D.   ANESTHESIA:  The patient was intubated in the operating room. She was  prepped and draped in the usual fashion. A uterine manipulator and a Kahn  catheter was inserted. The bladder was emptied of all urine.  Attention was  turned to the umbilicus where a small infraumbilical incision was placed.  After local was infiltrated there, the Veress needle was inserted with one  attempt and pneumoperitoneum was achieved in the standard fashion. The  Veress needle was removed, 11 mm trocar was secured with one __________.  The patient was placed in Trendelenburg position, a laparoscope was  introduced into the abdominal cavity and a 5 mm suprapubic trocar was placed  under visualization.  We then had a thorough exam of the abdomen and pelvis,  upper abdomen was normal, the liver and gallbladder appeared normal. The  left fallopian tube was normal, the fimbriated end was visualized and with  injection of the methylene blue it was normal filling and spillage of that  fallopian tube. The left ovary was normal. The uterus was slightly enlarged  but there were no obvious fibroids seen and it was retroflexed. The cul-de-  sac was inspected, no endometriosis or adhesions were noted. The right tube  was surgically absent and the right ovary was adherent to the  pelvic  sidewall. We then looked the ovary up and with sharp and blunt dissection  and hydrodissection we did lyse the adhesion. Irrigation was performed at  the end, hemostasis was noted. Again there was no evidence of endometriosis  seen in the pelvis or abdomen. The 5 mm trocar was removed and  pneumoperitoneum was released and the 11 mm trocar was removed. Incisions  were closed using 3-0 Vicryl interrupted. All instruments removed from the  vagina, all sponge, lap and instrument counts were correct x2.  The patient  tolerated the procedure well and went to the recovery room in stable  condition.                                               Meghan [name], M.D.    Florestine Avers  D:  08/09/2004  T:  08/10/2004  Job:  [id number]

## 2011-06-02 ENCOUNTER — Emergency Department (HOSPITAL_COMMUNITY)
Admission: EM | Admit: 2011-06-02 | Discharge: 2011-06-02 | Disposition: A | Discharge status: Home / Self Care | Payer: BC Managed Care – PPO | Attending: Emergency Medicine | Admitting: Emergency Medicine

## 2011-06-02 ENCOUNTER — Ambulatory Visit (INDEPENDENT_AMBULATORY_CARE_PROVIDER_SITE_OTHER): Payer: BC Managed Care – PPO | Admitting: Internal Medicine

## 2011-06-02 ENCOUNTER — Encounter: Payer: Self-pay | Admitting: Internal Medicine

## 2011-06-02 VITALS — BP 100/76 | HR 88 | Temp 98.9°F | Resp 16 | Ht 63.0 in | Wt 166.0 lb

## 2011-06-02 DIAGNOSIS — M545 Low back pain, unspecified: Secondary | ICD-10-CM

## 2011-06-02 DIAGNOSIS — L02219 Cutaneous abscess of trunk, unspecified: Secondary | ICD-10-CM

## 2011-06-02 DIAGNOSIS — L02212 Cutaneous abscess of back [any part, except buttock]: Secondary | ICD-10-CM | POA: Insufficient documentation

## 2011-06-02 DIAGNOSIS — L03319 Cellulitis of trunk, unspecified: Secondary | ICD-10-CM

## 2011-06-02 HISTORY — DX: Low back pain, unspecified: M54.50

## 2011-06-02 HISTORY — DX: Cutaneous abscess of back (any part, except buttock): L02.212

## 2011-06-02 MED ORDER — MEPERIDINE HCL 50 MG/ML IJ SOLN
50.0000 mg | Freq: Once | INTRAMUSCULAR | Status: AC
  Administered 2011-06-02: 50 mg via INTRAMUSCULAR

## 2011-06-02 MED ORDER — PROMETHAZINE HCL 50 MG/ML IJ SOLN
50.0000 mg | Freq: Four times a day (QID) | INTRAMUSCULAR | Status: DC | PRN
  Administered 2011-06-02: 50 mg via INTRAMUSCULAR

## 2011-06-02 MED ORDER — CEFTRIAXONE SODIUM 1 G IJ SOLR
1.0000 g | Freq: Once | INTRAMUSCULAR | Status: AC
  Administered 2011-06-02: 1 g via INTRAMUSCULAR

## 2011-06-02 NOTE — Assessment & Plan Note (Signed)
We gave her Rocephin 1 g and Demerol/Phenergan 50/50 im Discussed w/surgery - will send her to [facility] ER for I&D  Procedure Note :    Procedure :   Sonography examination   Indication: L spinal abscess vs cellulitis    Equipment used: Sonosite M-Turbo with  HFL38x/13-6 MHz transducer linear probe. The images were stored in the unit and later transferred in storage.  The patient was placed in a sitting position.   This study revealed a heteroechotic cavity 1.5  cm lesion under the red papule - L lumbar lateral spine region and a large area of cavitating subcutaneous fat edema 10x 5 cm    Impression: a large abscess L lumbar spine

## 2011-06-02 NOTE — Assessment & Plan Note (Signed)
Severe pain - Dem/Phen IM

## 2011-06-02 NOTE — Progress Notes (Signed)
  Subjective:    Patient ID: Meghan [name], female    DOB: 1976-07-23, 35 y.o.   MRN: [id number]  HPI  She had a pimple on L lower back on Thur - squeezed - pus came out  - worse since then She did not sleep last night Pain is 9/10 now     No Known Allergies  Review of Systems  Constitutional: Positive for fever, chills and fatigue. Negative for activity change, appetite change and unexpected weight change.  HENT: Negative for congestion, mouth sores and sinus pressure.   Eyes: Negative for visual disturbance.  Respiratory: Negative for cough and chest tightness.   Gastrointestinal: Negative for nausea and abdominal pain.  Genitourinary: Negative for frequency, difficulty urinating and vaginal pain.  Musculoskeletal: Positive for back pain. Negative for gait problem.  Skin: Negative for pallor and rash.  Neurological: Negative for dizziness, tremors, weakness, numbness and headaches.  Psychiatric/Behavioral: Negative for confusion and sleep disturbance.       Objective:   Physical Exam  Constitutional: She appears well-developed and well-nourished. No distress.       NAD  HENT:  Head: Normocephalic.  Right Ear: External ear normal.  Left Ear: External ear normal.  Nose: Nose normal.  Mouth/Throat: Oropharynx is clear and moist.  Eyes: Conjunctivae are normal. Pupils are equal, round, and reactive to light. Right eye exhibits no discharge. Left eye exhibits no discharge.  Neck: Normal range of motion. Neck supple. No JVD present. No tracheal deviation present. No thyromegaly present.  Cardiovascular: Normal rate, regular rhythm and normal heart sounds.   Pulmonary/Chest: No stridor. No respiratory distress. She has no wheezes.  Abdominal: Soft. Bowel sounds are normal. She exhibits no distension and no mass. There is no tenderness. There is no rebound and no guarding.  Musculoskeletal: She exhibits no edema and no tenderness.  Lymphadenopathy:    She has no cervical  adenopathy.  Neurological: She displays normal reflexes. No cranial nerve deficit. She exhibits normal muscle tone. Coordination normal.  Skin: No rash noted. No erythema.       Large firm erythematous swelling lat to LS spine to the left 6x10 cm  Psychiatric: She has a normal mood and affect. Her behavior is normal. Judgment and thought content normal.          Assessment & Plan:

## 2011-06-09 ENCOUNTER — Telehealth: Payer: Self-pay

## 2011-06-09 NOTE — Telephone Encounter (Signed)
[facility] Triage Call Report Triage Record Num: [id number] Operator: Chevis Pretty Patient Name: Marguarite Markov Call Date & Time: 06/08/2011 10:17:26AM Patient Phone: [phone] PCP: Patient Gender: Female PCP Fax : Patient DOB: [birth year] Practice Name: [facility] Reason for Call: Patient calling about pain in operative site. States had an abscess drained 06/02/11. Home health agency comes BID to pack the incision site. States is out of pain medications and would like pain medication called in to last until 06/10/11 when can see Dr. Posey Rea at office. States wound is reddened around the center of the site, but states home care staff tell her it is healing well. Denies emergent symptoms per protocol; home care advised, with callback parameters given. Per standing orders, Rx for tramedol 50mg  1-2 po q 4-6 h prn pain, #6, NR called in to [facility] [phone]. To follow up with office in AM. Protocol(s) Used: Skin Lesions Recommended Outcome per Protocol: See Provider within 24 hours Reason for Outcome: Painful lesion(s) unrelieved with home care measures Care Advice: Apply a warm or cool compress, or a cloth-covered ice pack, whichever feels better, to the area for 20 minutes every 2-3 hours while awake to relieve discomfort. ~ During pregnancy, call provider if temperature is 100 F (37.7 C) or greater OR any temperature elevation for 3 days even while taking acetaminophen. ~ ~ Keep any pressure off of the affected area, be careful to avoid injury to the area. ~ SYMPTOM / CONDITION MANAGEMENT ~ CAUTIONS Analgesic/Antipyretic Advice - Acetaminophen: Consider acetaminophen as directed on label or by pharmacist/provider for pain or fever PRECAUTIONS: - Use if there is no history of liver disease, alcoholism, or intake of three or more alcohol drinks per day - Only if approved by provider during pregnancy or when breastfeeding - During pregnancy, acetaminophen should not be  taken more than 3 consecutive days without telling provider - Do not exceed recommended dose or frequency ~ Analgesic/Antipyretic Advice - NSAIDs: Consider aspirin, ibuprofen, naproxen or ketoprofen for pain or fever as directed on label or by pharmacist/provider. PRECAUTIONS: - If over 69 years of age, should not take longer than 1 week without consulting provider. EXCEPTIONS: - Should not be used if taking blood thinners or have bleeding problems. - Do not use if have history of sensitivity/allergy to any of these medications; or history of cardiovascular, ulcer, kidney, liver disease or diabetes unless approved by provider. - Do not exceed recommended dose or frequency. ~ See a provider within 4 hours if signs of local infection occur (such as redness, warmth, tenderness, swelling; may have red streaks or purulent drainage) ~ 06/08/2011 10:33:08AM Page 1 of 1 [id number]

## 2011-06-09 NOTE — Telephone Encounter (Signed)
Noted. Thx.

## 2011-06-10 ENCOUNTER — Ambulatory Visit (INDEPENDENT_AMBULATORY_CARE_PROVIDER_SITE_OTHER): Payer: BC Managed Care – PPO | Admitting: General Surgery

## 2011-06-10 ENCOUNTER — Encounter (INDEPENDENT_AMBULATORY_CARE_PROVIDER_SITE_OTHER): Payer: Self-pay | Admitting: General Surgery

## 2011-06-10 VITALS — BP 112/66 | HR 60 | Temp 97.4°F | Ht 63.0 in | Wt 168.2 lb

## 2011-06-10 DIAGNOSIS — L02219 Cutaneous abscess of trunk, unspecified: Secondary | ICD-10-CM

## 2011-06-10 DIAGNOSIS — L02212 Cutaneous abscess of back [any part, except buttock]: Secondary | ICD-10-CM

## 2011-06-10 NOTE — Patient Instructions (Signed)
Continue covering with clean dry gauze after shower each day. F/U in 2-3 weeks

## 2011-06-10 NOTE — Op Note (Signed)
  NAMEILAMAE, GENG                ACCOUNT NO.:  [id number]  MEDICAL RECORD NO.:  [id number]  LOCATION:  WLED                         FACILITY:  [facility]  PHYSICIAN:  Wilmon Arms. Corliss Skains, M.D. DATE OF BIRTH:  January 20, 1976  DATE OF PROCEDURE: DATE OF DISCHARGE:  06/02/2011                              OPERATIVE REPORT   PROCEDURE:  Bedside incision and drainage of back abscess.  PERMIT:  The procedure including risks and complications such as bleeding and infection were explained to the patient prior to intervention.  The patient understood the reasoning that this was necessary as well as the risks and agree to further intervention.  INDICATION:  Ms. Avilla is a 35 year old black female, who developed a pimple on her lower left back approximately a week ago.  She states that she popped this; however, over the week it began to grow in size.  It did drain some purulent drainage.  As the week progressed, this area began getting larger and more painful.  She saw her primary care physician today who referred her to [place] for further incision and drainage.  PHYSICIAN:  Letha Cape, PA-C, under Wilmon Arms. Lylie Blacklock, M.D.  DESCRIPTION OF THE PROCEDURE:  The patient was placed in the prone position.  This area on her left lower back was prepped with Betadine. Once this area was cleaned, 2% Xylocaine was used to anesthetize the area.  Approximately 15 mL of anesthetic was used.  Once this area was anesthetized, an 11 blade scalpel was then used to make an approximate 3- 1/2 cm incision.  Once this area was opened up, hemostats were used to explore the cavity.  This was a fairly large cavity.  Just medial to the incision, there was a relatively large pocket of purulent drainage that was hit.  Approximately 5 mL of thick and purulent drainage was expressed.  The cavity was further explored and cleaned out.  A dry 4x4 was then placed in the cavity to achieve hemostasis.  Once hemostasis was achieved,  this was removed and a normal saline wet-to-dry dressing change was packed.  This was then covered with dry gauze and tape was placed.  COMPLICATIONS:  None.  ESTIMATED BLOOD LOSS:  Minimal.  DISPOSITION:  The patient is alert and oriented and resting comfortably. She did complain of some pain and therefore 1 mg of Dilaudid IM was given.  In the meantime, home health has been arranged for dressing changes for the patient and she will be otherwise stable for discharge home from the Emergency Department.     Letha Cape, PA   ______________________________ Wilmon Arms. Corliss Skains, M.D.    KEO/MEDQ  D:  06/02/2011  T:  06/02/2011  Job:  [id number]  cc:   Georgina Quint. Plotnikov, MD [address] [place] [address]  Electronically Signed by Barnetta Chapel PA on 06/05/2011 11:57:00 AM Electronically Signed by Manus Rudd M.D. on 06/10/2011 01:03:12 PM

## 2011-06-10 NOTE — Consult Note (Signed)
Meghan [name], Meghan [name]                ACCOUNT NO.:  [id number]  MEDICAL RECORD NO.:  [id number]  LOCATION:  WLED                         FACILITY:  [facility]  PHYSICIAN:  Wilmon Arms. Corliss Skains, M.D. DATE OF BIRTH:  1976-08-23  DATE OF CONSULTATION:  06/02/2011 DATE OF DISCHARGE:  06/02/2011                                CONSULTATION   TIME OF CONSULTATION:  12:45 p.m.  REFERRING PHYSICIAN:  Georgina Quint. Plotnikov, MD  CONSULTING SURGEON:  Wilmon Arms. Corliss Skains, MD  PRIMARY CARE PHYSICIAN:  Georgina Quint. Plotnikov, MD  REASON FOR CONSULTATION:  Back abscess.  HISTORY OF PRESENT ILLNESS:  Meghan [name] is a 35 year old otherwise healthy black female who developed a pimple on the left side of her lower back last Tuesday.  She popped it.  It then developed a larger pimple that felt like a knot.  They squeezed this and did get some purulent drainage as well as blood.  Over the next several days, it has progressively worsened and gotten bigger and more painful.  She denies any fevers at home but does admit to some chills.  She saw her primary care physician today in his office who felt that this needed to be surgically drained.  Therefore, we were called over here at [facility]. We asked for the patient to be transferred to [facility] Emergency Department where we could evaluate her.  Upon arrival, I did evaluate her and felt that she did need incision and drainage.  REVIEW OF SYSTEMS:  Please see HPI.  Otherwise all other systems have been reviewed and are negative.  FAMILY HISTORY:  Noncontributory.  PAST MEDICAL HISTORY:  Ectopic pregnancy, otherwise negative.  PAST SURGICAL HISTORY: 1. C-section x2. 2. Laparoscopic salpingo-oophorectomy for ectopic pregnancy.  SOCIAL HISTORY:  The patient has 2 children.  She has a boyfriend.  She occasionally uses alcohol but denies any tobacco.  She works for school system.  ALLERGIES:  NKDA.  MEDICATIONS: 1. Ambien 10 mg q.h.s. p.r.n. sleep. 2.  Xanax p.r.n.  PHYSICAL EXAMINATION:  GENERAL:  Meghan [name] is a very pleasant well- developed, well-nourished 35 year old black female who is currently lying in bed, in no acute distress. VITAL SIGNS:  Have not been taken at this time. HEENT:  Head is normocephalic, atraumatic.  Sclerae noninjected.  Pupils are equal, round, reactive to light.  Ears and nose without any obvious masses or lesions.  No rhinorrhea.  Mouth is pink.  Throat shows no exudate. HEART:  Regular rate and rhythm.  Normal S1, S2.  No murmurs, gallops or rubs are noted.  She does have palpable carotid, radial and pedal pulses bilaterally. LUNGS:  Clear to auscultation bilaterally.  No wheezes, rhonchi, or rales noted.  Respiratory effort is nonlabored. ABDOMEN:  Soft, nontender, nondistended with active bowel sounds.  No masses, hernias or organomegaly noted. SKIN:  There is an approximate 8 x 4 cm back abscess just noted lateral on the patient's left side of midline.  This area does have a fluctuance as well as erythema.  It is very tender to palpation. PSYCH:  The patient is alert and oriented x3 with an appropriate affect.  LABORATORY DATA AND DIAGNOSTICS:  None.  IMPRESSION:  Left-sided lower back abscess.  PLAN:  At this time I have discussed with the patient a bedside incision and drainage.  I have discussed risks and complications including bleeding and infection.  The patient is agreeable to this.  We will plan on after bedside I and D packing her wound and arranging home health for normal saline wet-to-dry dressing changes b.i.d.  I will place the place on 10 days worth of Bactrim DS.  I will also give her a prescription for pain medicine.  She is informed to follow up with [place] in our office on June 10, 2011, at 2:30 p.m. for an urgent office visit for followup for her wound.  She otherwise may shower and she is to remove her packing prior to showering.  She is to keep this area covered and she  is informed not to sit in the bath tub, swimming pool, ocean, or hot tub. She is to call our office for fever greater than 101.5 or worsening pain.     Letha Cape, PA   ______________________________ Wilmon Arms. Corliss Skains, M.D.    KEO/MEDQ  D:  06/02/2011  T:  06/02/2011  Job:  [id number]  cc:   [facility]  Georgina Quint. Plotnikov, MD [address] [place] [address]  Electronically Signed by Barnetta Chapel PA on 06/05/2011 11:57:06 AM Electronically Signed by Manus Rudd M.D. on 06/10/2011 01:03:16 PM

## 2011-06-10 NOTE — Progress Notes (Signed)
Subjective:     Patient ID: Meghan [name], female   DOB: 06/06/1976, 35 y.o.   MRN: [id number]    BP 112/66  Pulse 60  Temp(Src) 97.4 F (36.3 C) (Temporal)  Ht 5\' 3"  (1.6 m)  Wt 168 lb 3.2 oz (76.295 kg)  BMI 29.80 kg/m2    HPI The patient is a 35 year old black female who was recently seen by our physician assistant. She had an incision and drainage of an abscess on her low back. She has done well since then. She has had no fevers or drainage.  Review of Systems     Objective:   Physical Exam The patient's wound is very shallow and clean. There is no evidence of infection.    Assessment:     Back abscess.    Plan:     She will continue discrepancy or in the shower and apply a daily gauze dressing to the area. We will see her back again in 2-3 weeks to check the wound.

## 2011-06-12 DIAGNOSIS — L02219 Cutaneous abscess of trunk, unspecified: Secondary | ICD-10-CM

## 2011-06-12 DIAGNOSIS — L03319 Cellulitis of trunk, unspecified: Secondary | ICD-10-CM

## 2011-07-03 ENCOUNTER — Encounter (INDEPENDENT_AMBULATORY_CARE_PROVIDER_SITE_OTHER): Payer: BC Managed Care – PPO | Admitting: General Surgery

## 2011-07-23 ENCOUNTER — Encounter (INDEPENDENT_AMBULATORY_CARE_PROVIDER_SITE_OTHER): Payer: Self-pay | Admitting: General Surgery

## 2011-08-27 LAB — CBC
HCT: 30.8 — ABNORMAL LOW
Hemoglobin: 10.5 — ABNORMAL LOW
Hemoglobin: 13.3
MCHC: 34
MCV: 93.5
RBC: 3.29 — ABNORMAL LOW
RBC: 4.19
WBC: 14.8 — ABNORMAL HIGH

## 2011-08-27 LAB — RPR: RPR Ser Ql: NONREACTIVE

## 2011-08-27 LAB — URINALYSIS, ROUTINE W REFLEX MICROSCOPIC
Bilirubin Urine: NEGATIVE
Nitrite: NEGATIVE
Specific Gravity, Urine: 1.01
Urobilinogen, UA: 0.2
pH: 7

## 2011-09-04 LAB — POCT PREGNANCY, URINE: Preg Test, Ur: NEGATIVE

## 2011-09-04 LAB — URINALYSIS, ROUTINE W REFLEX MICROSCOPIC
Bilirubin Urine: NEGATIVE
Glucose, UA: NEGATIVE
Ketones, ur: NEGATIVE
pH: 6.5

## 2011-09-04 LAB — URINE MICROSCOPIC-ADD ON

## 2011-09-16 LAB — URINE MICROSCOPIC-ADD ON

## 2011-09-16 LAB — STREP B DNA PROBE: Strep Group B Ag: NEGATIVE

## 2011-09-16 LAB — URINALYSIS, ROUTINE W REFLEX MICROSCOPIC
Bilirubin Urine: NEGATIVE
Glucose, UA: NEGATIVE
Glucose, UA: NEGATIVE
Glucose, UA: NEGATIVE
Hgb urine dipstick: NEGATIVE
Ketones, ur: 15 — AB
Ketones, ur: NEGATIVE
Leukocytes, UA: NEGATIVE
Nitrite: NEGATIVE
Protein, ur: 30 — AB
Protein, ur: NEGATIVE
Urobilinogen, UA: 0.2
pH: 7

## 2012-01-19 ENCOUNTER — Encounter (HOSPITAL_BASED_OUTPATIENT_CLINIC_OR_DEPARTMENT_OTHER): Payer: Self-pay | Admitting: Family Medicine

## 2012-01-19 ENCOUNTER — Other Ambulatory Visit: Payer: Self-pay

## 2012-01-19 ENCOUNTER — Emergency Department (HOSPITAL_BASED_OUTPATIENT_CLINIC_OR_DEPARTMENT_OTHER)
Admission: EM | Admit: 2012-01-19 | Discharge: 2012-01-19 | Disposition: A | Discharge status: Home / Self Care | Payer: BC Managed Care – PPO | Attending: Emergency Medicine | Admitting: Emergency Medicine

## 2012-01-19 DIAGNOSIS — R002 Palpitations: Secondary | ICD-10-CM | POA: Insufficient documentation

## 2012-01-19 DIAGNOSIS — F411 Generalized anxiety disorder: Secondary | ICD-10-CM | POA: Insufficient documentation

## 2012-01-19 HISTORY — DX: Anxiety disorder, unspecified: F41.9

## 2012-01-19 LAB — DIFFERENTIAL
Basophils Absolute: 0 10*3/uL (ref 0.0–0.1)
Lymphocytes Relative: 41 % (ref 12–46)
Monocytes Absolute: 0.8 10*3/uL (ref 0.1–1.0)
Neutro Abs: 4.6 10*3/uL (ref 1.7–7.7)
Neutrophils Relative %: 49 % (ref 43–77)

## 2012-01-19 LAB — BASIC METABOLIC PANEL
Chloride: 101 mEq/L (ref 96–112)
GFR calc Af Amer: >90 mL/min (ref >90)
GFR calc non Af Amer: >90 mL/min (ref >90)
Potassium: 3.6 mEq/L (ref 3.5–5.1)

## 2012-01-19 LAB — CBC
HCT: 44.8 % (ref 36.0–46.0)
Hemoglobin: 15.4 g/dL — ABNORMAL HIGH (ref 12.0–15.0)
RDW: 12.6 % (ref 11.5–15.5)
WBC: 9.4 10*3/uL (ref 4.0–10.5)

## 2012-01-19 LAB — PREGNANCY, URINE: Preg Test, Ur: NEGATIVE

## 2012-01-19 LAB — TSH: TSH: 1.73 u[IU]/mL (ref 0.350–4.500)

## 2012-01-19 NOTE — Discharge Instructions (Signed)
Palpitations  A palpitation is the feeling that your heartbeat is irregular or is faster than normal. Although this is frightening, it usually is not serious. Palpitations may be caused by excesses of smoking, caffeine, or alcohol. They are also brought on by stress and anxiety. Sometimes, they are caused by heart disease. Unless otherwise noted, your caregiver did not find any signs of serious illness at this time. HOME CARE INSTRUCTIONS  To help prevent palpitations:  Drink decaffeinated coffee, tea, and soda pop. Avoid chocolate.   If you smoke or drink alcohol, quit or cut down as much as possible.   Reduce your stress or anxiety level. Biofeedback, yoga, or meditation will help you relax. Physical activity such as swimming, jogging, or walking also may be helpful.  SEEK MEDICAL CARE IF:   You continue to have a fast heartbeat.   Your palpitations occur more often.  SEEK IMMEDIATE MEDICAL CARE IF: You develop chest pain, shortness of breath, severe headache, dizziness, or fainting. Document Released: 11/21/2000 Document Revised: 08/06/2011 Document Reviewed: 01/21/2008 [facility] Patient Information 2012 [place], [place].  Your caregiver has considered some of the most common causes of palpitations and feels it is safe for you to go home and be observed. Not every illness or injury can be identified during an emergency department visit, thus follow-up with your primary healthcare provider is important. Medical conditions can also worsen, so it is also important to return immediately as directed below, or if you have other serious concerns develop such as resting pulse rate over 120 beats per minute. RETURN IMMEDIATELY IF you develop new shortness of breath, chest pain, fever, have difficulty moving parts of your body (new weakness, numbness, or incoordination), sudden change in speech, vision, swallowing, or understanding, faint or develop new dizziness, severe headache, become poorly  responsive or have an altered mental status compared to baseline for you, new rash, abdominal pain, or bloody stools,  Return sooner also if you develop new problems for which you have not talked to your caregiver but you feel may be emergency medical conditions, or are unable to be cared for safely at home.

## 2012-01-19 NOTE — ED Provider Notes (Signed)
This chart was scribed for Hurman Horn, MD by Wallis Mart. The patient was seen in room MH04/MH04 and the patient's care was started at 7:13 PM.   CSN: [id number]  Arrival date & time 01/19/12  0865   First MD Initiated Contact with Patient 01/19/12 1900      Chief Complaint  Patient presents with  . Palpitations    (Consider location/radiation/quality/duration/timing/severity/associated sxs/prior treatment) HPI  Meghan [name] is a 36 y.o. female who presents to the Emergency Department complaining sudden onset, intermittent episodes of heart fluttering and racing onset 2 weeks ago. Pt states that episodes of heart racing last around 3 minutes and occur every few days. The fluttering episodes come on suddenly and occur several times per hour. Pt c/o associated mild chest tightness and SOB.  Exertion does not bring on these spells and pt does not pass out during spells.  Pt states she has been under a lot of stress lately, but is generally healthy at baseline.  Pt drinks  occassionally but not caffeine.  Pt has no h/o thyroid problems and no blood clots.  Pt denies fever, numbness, chest pain. There are no other associated symptoms and no other alleviating or aggravating factors.   Past Medical History  Diagnosis Date  . Anxiety     Past Surgical History  Procedure Date  . Incise and drain abcess     back  . Cesarean section     Family History  Problem Relation Age of Onset  . Hypertension Father     History  Substance Use Topics  . Smoking status: Never Smoker   . Smokeless tobacco: Not on file  . Alcohol Use: No    OB History    Grav Para Term Preterm Abortions TAB SAB Ect Mult Living                  Review of Systems  Constitutional: Negative for fever.       10 Systems reviewed and are negative for acute change except as noted in the HPI.  HENT: Negative for congestion.   Eyes: Negative for discharge and redness.  Respiratory: Negative for cough and  shortness of breath.   Cardiovascular: Positive for palpitations. Negative for chest pain.  Gastrointestinal: Negative for vomiting and abdominal pain.  Musculoskeletal: Negative for back pain.  Skin: Negative for rash.  Neurological: Negative for syncope, numbness and headaches.  Psychiatric/Behavioral:       No behavior change.    Allergies  Review of patient's allergies indicates no known allergies.  Home Medications   Current Outpatient Rx  Name Route Sig Dispense Refill  . FLINTSTONES/EXTRA C PO CHEW Oral Chew 2 tablets by mouth daily.    Marland Kitchen ZOLPIDEM TARTRATE 10 MG PO TABS Oral Take 10 mg by mouth at bedtime.     . ALPRAZOLAM 0.5 MG PO TABS Oral Take 1 tablet (0.5 mg total) by mouth 2 (two) times daily. 60 tablet 1    BP 103/63  Pulse 74  Temp(Src) 98 F (36.7 C) (Oral)  Resp 18  Ht 5\' 3"  (1.6 m)  Wt 168 lb (76.204 kg)  BMI 29.76 kg/m2  SpO2 99%  Physical Exam  Nursing note and vitals reviewed. Constitutional:       Awake, alert, nontoxic appearance.  HENT:  Head: Atraumatic.  Eyes: Right eye exhibits no discharge. Left eye exhibits no discharge.  Neck: Neck supple. No thyromegaly present.  Cardiovascular: Normal rate, regular rhythm and normal heart sounds.  No murmur heard. Pulmonary/Chest: Effort normal and breath sounds normal. She exhibits no tenderness.  Abdominal: Soft. There is no tenderness. There is no rebound.  Musculoskeletal: She exhibits no tenderness.       Baseline ROM, no obvious new focal weakness.  Neurological:       Mental status and motor strength appears baseline for patient and situation.  Skin: No rash noted.  Psychiatric: She has a normal mood and affect.    ED Course  Procedures (including critical care time)  ECG: Sinus rhythm, ventricular rate 68, normal axis, normal intervals, no acute ischemic changes noted, no comparison ECG available, normal ECG DIAGNOSTIC STUDIES: Oxygen Saturation is 99% on room air, normal by my  interpretation.    COORDINATION OF CARE:  10:07: Pt had no palpitations while at ED on monitor.  EDP at pt bedside.  All results reviewed and discussed with pt, questions answered, pt agreeable with plan. Pt to be discharged.     Labs Reviewed  CBC - Abnormal; Notable for the following:    Hemoglobin 15.4 (*)    All other components within normal limits  BASIC METABOLIC PANEL  DIFFERENTIAL  TSH  PREGNANCY, URINE  TSH   No results found.   1. Palpitations       MDM  I doubt any other EMC precluding discharge at this time including, but not necessarily limited to the following:Vtach.  I personally performed the services described in this documentation, which was scribed in my presence. The recorded information has been reviewed and considered.     Hurman Horn, MD 01/23/12 [phone]

## 2012-01-19 NOTE — ED Notes (Signed)
Pt c/o feeling "heart fluttering and light headed" x 2 wks.

## 2012-01-21 ENCOUNTER — Telehealth: Payer: Self-pay

## 2012-01-21 NOTE — Telephone Encounter (Signed)
Pt called requesting the results of TSH done at the hospital earlier this week. Pt is also requesting a refill of Xanax, please advise.

## 2012-01-22 MED ORDER — ALPRAZOLAM 0.5 MG PO TABS: 0.5000 mg | ORAL_TABLET | Freq: Two times a day (BID) | ORAL | Status: DC

## 2012-01-22 NOTE — Telephone Encounter (Signed)
TSH was nl Call in Xanax pls - see meds Sch OV Thx

## 2012-01-23 NOTE — Telephone Encounter (Signed)
Pt informed rf phoned in. 

## 2012-04-13 ENCOUNTER — Ambulatory Visit: Payer: BC Managed Care – PPO | Admitting: Internal Medicine

## 2012-06-14 ENCOUNTER — Ambulatory Visit: Payer: BC Managed Care – PPO | Admitting: Internal Medicine

## 2012-06-14 DIAGNOSIS — Z0289 Encounter for other administrative examinations: Secondary | ICD-10-CM

## 2012-06-22 ENCOUNTER — Telehealth: Payer: Self-pay | Admitting: *Deleted

## 2012-06-22 MED ORDER — ALPRAZOLAM 0.5 MG PO TABS: 0.5000 mg | ORAL_TABLET | Freq: Two times a day (BID) | ORAL | Status: DC

## 2012-06-22 NOTE — Telephone Encounter (Signed)
OK to fill this prescription with additional refills x0 Needs OV Thank you!  

## 2012-06-22 NOTE — Telephone Encounter (Signed)
Rf req for Alprazolam 0.5mg 1 po bid. Ok to Rf? 

## 2012-06-22 NOTE — Telephone Encounter (Signed)
Done

## 2012-07-02 ENCOUNTER — Ambulatory Visit: Payer: BC Managed Care – PPO | Admitting: Internal Medicine

## 2012-08-23 ENCOUNTER — Encounter: Payer: Self-pay | Admitting: Internal Medicine

## 2012-08-23 ENCOUNTER — Ambulatory Visit (INDEPENDENT_AMBULATORY_CARE_PROVIDER_SITE_OTHER): Payer: BC Managed Care – PPO | Admitting: Internal Medicine

## 2012-08-23 VITALS — BP 122/70 | HR 76 | Temp 98.3°F | Resp 16 | Wt 176.0 lb

## 2012-08-23 DIAGNOSIS — Z23 Encounter for immunization: Secondary | ICD-10-CM

## 2012-08-23 DIAGNOSIS — L309 Dermatitis, unspecified: Secondary | ICD-10-CM

## 2012-08-23 DIAGNOSIS — L259 Unspecified contact dermatitis, unspecified cause: Secondary | ICD-10-CM

## 2012-08-23 DIAGNOSIS — L299 Pruritus, unspecified: Secondary | ICD-10-CM

## 2012-08-23 DIAGNOSIS — F411 Generalized anxiety disorder: Secondary | ICD-10-CM

## 2012-08-23 DIAGNOSIS — L308 Other specified dermatitis: Secondary | ICD-10-CM

## 2012-08-23 HISTORY — DX: Other specified dermatitis: L30.8

## 2012-08-23 HISTORY — DX: Dermatitis, unspecified: L30.9

## 2012-08-23 MED ORDER — HYDROXYZINE HCL 25 MG PO TABS: 25.0000 mg | ORAL_TABLET | Freq: Three times a day (TID) | ORAL | Status: DC | PRN

## 2012-08-23 MED ORDER — ZOLPIDEM TARTRATE 10 MG PO TABS: 10.0000 mg | ORAL_TABLET | Freq: Every day | ORAL | Status: DC

## 2012-08-23 MED ORDER — TRIAMCINOLONE ACETONIDE 0.5 % EX CREA: TOPICAL_CREAM | Freq: Three times a day (TID) | CUTANEOUS | Status: DC

## 2012-08-23 MED ORDER — ALPRAZOLAM 0.5 MG PO TABS: 0.5000 mg | ORAL_TABLET | Freq: Two times a day (BID) | ORAL | Status: DC

## 2012-08-23 MED ORDER — METHYLPREDNISOLONE ACETATE 80 MG/ML IJ SUSP
120.0000 mg | Freq: Once | INTRAMUSCULAR | Status: AC
  Administered 2012-08-23: 120 mg via INTRAMUSCULAR

## 2012-08-23 NOTE — Progress Notes (Signed)
   Subjective:    Patient ID: Meghan [name], female    DOB: Jan 14, 1976, 36 y.o.   MRN: [id number]  HPI  C/o rash and itching x 2 mo - treated her appt for bed bugs - no new rash since then     No Known Allergies  Review of Systems  Constitutional: Negative for fever, chills, activity change, appetite change, fatigue and unexpected weight change.  HENT: Negative for congestion, mouth sores and sinus pressure.   Eyes: Negative for visual disturbance.  Respiratory: Negative for cough and chest tightness.   Gastrointestinal: Negative for nausea and abdominal pain.  Genitourinary: Negative for frequency, difficulty urinating and vaginal pain.  Musculoskeletal: Negative for back pain and gait problem.  Skin: Positive for rash. Negative for pallor.  Neurological: Negative for dizziness, tremors, weakness, numbness and headaches.  Psychiatric/Behavioral: Negative for confusion and disturbed wake/sleep cycle.       Objective:   Physical Exam  Constitutional: She appears well-developed and well-nourished. No distress.       NAD  HENT:  Head: Normocephalic.  Right Ear: External ear normal.  Left Ear: External ear normal.  Nose: Nose normal.  Mouth/Throat: Oropharynx is clear and moist.  Eyes: Conjunctivae normal are normal. Pupils are equal, round, and reactive to light. Right eye exhibits no discharge. Left eye exhibits no discharge.  Neck: Normal range of motion. Neck supple. No JVD present. No tracheal deviation present. No thyromegaly present.  Cardiovascular: Normal rate, regular rhythm and normal heart sounds.   Pulmonary/Chest: No stridor. No respiratory distress. She has no wheezes.  Abdominal: Soft. Bowel sounds are normal. She exhibits no distension and no mass. There is no tenderness. There is no rebound and no guarding.  Musculoskeletal: She exhibits no edema and no tenderness.  Lymphadenopathy:    She has no cervical adenopathy.  Neurological: She displays normal  reflexes. No cranial nerve deficit. She exhibits normal muscle tone. Coordination normal.  Skin: No rash noted. No erythema.       eryth papules 3-5 mm on trunk, legs, arms, feet  Psychiatric: She has a normal mood and affect. Her behavior is normal. Judgment and thought content normal.          Assessment & Plan:

## 2012-08-23 NOTE — Assessment & Plan Note (Signed)
Depomerol 120 mg im Hydroxyzine prn

## 2012-08-23 NOTE — Assessment & Plan Note (Signed)
Triamc 0.5% cram tid

## 2012-08-23 NOTE — Assessment & Plan Note (Signed)
Meds refilled.

## 2012-09-02 ENCOUNTER — Telehealth: Payer: Self-pay | Admitting: Internal Medicine

## 2012-09-02 ENCOUNTER — Ambulatory Visit (INDEPENDENT_AMBULATORY_CARE_PROVIDER_SITE_OTHER): Payer: BC Managed Care – PPO | Admitting: Endocrinology

## 2012-09-02 ENCOUNTER — Encounter: Payer: Self-pay | Admitting: *Deleted

## 2012-09-02 VITALS — BP 126/80 | Temp 98.7°F | Wt 174.0 lb

## 2012-09-02 DIAGNOSIS — J069 Acute upper respiratory infection, unspecified: Secondary | ICD-10-CM

## 2012-09-02 MED ORDER — CEFUROXIME AXETIL 250 MG PO TABS: 250.0000 mg | ORAL_TABLET | Freq: Two times a day (BID) | ORAL | Status: AC

## 2012-09-02 MED ORDER — PROMETHAZINE-CODEINE 6.25-10 MG/5ML PO SYRP: 5.0000 mL | ORAL_SOLUTION | ORAL | Status: AC | PRN

## 2012-09-02 NOTE — Progress Notes (Signed)
  Subjective:    Patient ID: Meghan [name], female    DOB: 1976/08/30, 36 y.o.   MRN: [id number]  HPI Pt states 1 week of moderate prod-quality cough in the chest, but no assoc sob. Past Medical History  Diagnosis Date  . Anxiety     Past Surgical History  Procedure Date  . Incise and drain abcess     back  . Cesarean section     History   Social History  . Marital Status: Single    Spouse Name: N/A    Number of Children: N/A  . Years of Education: N/A   Occupational History  . Not on file.   Social History Main Topics  . Smoking status: Never Smoker   . Smokeless tobacco: Not on file  . Alcohol Use: No  . Drug Use: No  . Sexually Active: Not on file   Other Topics Concern  . Not on file   Social History Narrative  . No narrative on file    Current Outpatient Prescriptions on File Prior to Visit  Medication Sig Dispense Refill  . ALPRAZolam (XANAX) 0.5 MG tablet Take 1 tablet (0.5 mg total) by mouth 2 (two) times daily.  60 tablet  1  . hydrOXYzine (ATARAX/VISTARIL) 25 MG tablet Take 1 tablet (25 mg total) by mouth every 8 (eight) hours as needed for itching.  60 tablet  0  . multivitamin (BARIATRIC VIT W/EXTRA C) CHEW Chew 2 tablets by mouth daily.      Marland Kitchen triamcinolone cream (KENALOG) 0.5 % Apply topically 3 (three) times daily.  120 g  1  . zolpidem (AMBIEN) 10 MG tablet Take 1 tablet (10 mg total) by mouth at bedtime.  30 tablet  1   Current Facility-Administered Medications on File Prior to Visit  Medication Dose Route Frequency Provider Last Rate Last Dose  . promethazine (PHENERGAN) injection 50 mg  50 mg Intramuscular Q6H PRN Tresa Garter, MD   50 mg at 06/02/11 1822    No Known Allergies  Family History  Problem Relation Age of Onset  . Hypertension Father     BP 126/80  Temp 98.7 F (37.1 C) (Oral)  Wt 174 lb (78.926 kg)    Review of Systems Denies fever and wheezing    Objective:   Physical Exam VITAL SIGNS:  See vs  page GENERAL: no distress head: no deformity eyes: no periorbital swelling, no proptosis external nose and ears are normal mouth: no lesion seen Both eac's and tm's are normal LUNGS:  Clear to auscultation      Assessment & Plan:  Acute bronchitis, new

## 2012-09-02 NOTE — Telephone Encounter (Signed)
Caller: [name]/Patient; Patient Name: Meghan [name]; PCP: Sonda Primes (Adults only); Best Callback Phone Number: [phone]; Reason for call: Cough/Congestion. Onset 08/25/12.  Temp 99.9 Temporal at 1158 /chills at night. Productive cough with yellow phlem.  No relief with Alka Seltzer.  LMP/Mirena IUD.  Advised to see MD wihtin 24 hours for productive cough with colored sputum per Cough guideline. Dr Posey Rea scheduled for AM on  09/02/12. Prefers appointment 09/02/12.   Scheduled for 09/02/12 at 1345 with Dr Everardo All.

## 2012-09-02 NOTE — Patient Instructions (Addendum)
Here are 2 prescriptions: cough syrup and antibiotic Loratadine (non-prescription) will help your congestion. I hope you feel better soon.  If you don't feel better by next week, please call back.  Please call sooner if you get worse.

## 2012-12-22 ENCOUNTER — Telehealth: Payer: Self-pay | Admitting: Internal Medicine

## 2012-12-22 MED ORDER — VALACYCLOVIR HCL 500 MG PO TABS: 500.0000 mg | ORAL_TABLET | Freq: Two times a day (BID) | ORAL | Status: DC

## 2012-12-22 NOTE — Telephone Encounter (Signed)
Please advise- med isn't on active list.

## 2012-12-22 NOTE — Telephone Encounter (Signed)
From: [facility]  Date/Time: 12/20/2012 6:30 PM Caller: Daisy Blossom  Facility: home Patient: Meghan [name] DOB: [birth year] Phone: [phone] Reason for Call: Patient calling, asking for a refill on Valtrex . Has a fever blister. Has had a refill in over a year but was seen recently in the office. Has switched to [facility] [address].

## 2012-12-22 NOTE — Telephone Encounter (Signed)
Ok done

## 2013-01-16 ENCOUNTER — Other Ambulatory Visit: Payer: Self-pay | Admitting: Internal Medicine

## 2013-01-17 NOTE — Telephone Encounter (Signed)
Last written 08/23/2012 #30 with 1 refill-please advise.

## 2013-01-17 NOTE — Telephone Encounter (Signed)
Ash, This is a patient of Dr .Posey Rea, I have never seen her. Rene Kocher

## 2013-01-19 NOTE — Telephone Encounter (Signed)
Rx's phoned into [facility], pt informed via VM to callback office to schedule OV.

## 2013-01-19 NOTE — Telephone Encounter (Signed)
sch OV 

## 2013-03-10 ENCOUNTER — Other Ambulatory Visit: Payer: Self-pay | Admitting: Obstetrics and Gynecology

## 2014-02-23 ENCOUNTER — Ambulatory Visit: Payer: BC Managed Care – PPO | Admitting: Physician Assistant

## 2014-03-20 ENCOUNTER — Other Ambulatory Visit: Payer: Self-pay | Admitting: Obstetrics and Gynecology

## 2014-10-15 ENCOUNTER — Emergency Department (HOSPITAL_BASED_OUTPATIENT_CLINIC_OR_DEPARTMENT_OTHER)
Admission: EM | Admit: 2014-10-15 | Discharge: 2014-10-15 | Disposition: A | Discharge status: Home / Self Care | Payer: BC Managed Care – PPO | Attending: Emergency Medicine | Admitting: Emergency Medicine

## 2014-10-15 ENCOUNTER — Encounter (HOSPITAL_BASED_OUTPATIENT_CLINIC_OR_DEPARTMENT_OTHER): Payer: Self-pay | Admitting: *Deleted

## 2014-10-15 DIAGNOSIS — F419 Anxiety disorder, unspecified: Secondary | ICD-10-CM | POA: Diagnosis not present

## 2014-10-15 DIAGNOSIS — R350 Frequency of micturition: Secondary | ICD-10-CM | POA: Diagnosis present

## 2014-10-15 DIAGNOSIS — Z7952 Long term (current) use of systemic steroids: Secondary | ICD-10-CM | POA: Insufficient documentation

## 2014-10-15 DIAGNOSIS — Z3202 Encounter for pregnancy test, result negative: Secondary | ICD-10-CM | POA: Insufficient documentation

## 2014-10-15 DIAGNOSIS — N3000 Acute cystitis without hematuria: Secondary | ICD-10-CM | POA: Diagnosis not present

## 2014-10-15 DIAGNOSIS — Z79899 Other long term (current) drug therapy: Secondary | ICD-10-CM | POA: Insufficient documentation

## 2014-10-15 LAB — URINALYSIS, ROUTINE W REFLEX MICROSCOPIC
Bilirubin Urine: NEGATIVE
Glucose, UA: NEGATIVE mg/dL
KETONES UR: NEGATIVE mg/dL
NITRITE: NEGATIVE
PH: 6 (ref 5.0–8.0)
Protein, ur: NEGATIVE mg/dL
SPECIFIC GRAVITY, URINE: 1.026 (ref 1.005–1.030)
Urobilinogen, UA: 0.2 mg/dL (ref 0.0–1.0)

## 2014-10-15 LAB — URINE MICROSCOPIC-ADD ON

## 2014-10-15 LAB — PREGNANCY, URINE: PREG TEST UR: NEGATIVE

## 2014-10-15 MED ORDER — CIPROFLOXACIN HCL 500 MG PO TABS: 500.0000 mg | ORAL_TABLET | Freq: Two times a day (BID) | ORAL | Status: DC

## 2014-10-15 MED ORDER — PHENAZOPYRIDINE HCL 200 MG PO TABS: 200.0000 mg | ORAL_TABLET | Freq: Three times a day (TID) | ORAL | Status: DC | PRN

## 2014-10-15 NOTE — ED Provider Notes (Signed)
CSN: [id number]     Arrival date & time 10/15/14  1275 History   First MD Initiated Contact with Patient 10/15/14 0804     Chief Complaint  Patient presents with  . Urinary Frequency     (Consider location/radiation/quality/duration/timing/severity/associated sxs/prior Treatment) Patient is a 38 y.o. female presenting with frequency. The history is provided by the patient.  Urinary Frequency This is a new problem. The current episode started 2 days ago. The problem occurs constantly. The problem has been gradually worsening. Pertinent negatives include no chest pain. Nothing aggravates the symptoms. Nothing relieves the symptoms. She has tried nothing for the symptoms. The treatment provided no relief.    Past Medical History  Diagnosis Date  . Anxiety    Past Surgical History  Procedure Laterality Date  . Incise and drain abcess      back  . Cesarean section     Family History  Problem Relation Age of Onset  . Hypertension Father    History  Substance Use Topics  . Smoking status: Never Smoker   . Smokeless tobacco: Not on file  . Alcohol Use: No   OB History    No data available     Review of Systems  Cardiovascular: Negative for chest pain.  Genitourinary: Positive for frequency.  All other systems reviewed and are negative.     Allergies  Review of patient's allergies indicates no known allergies.  Home Medications   Prior to Admission medications   Medication Sig Start Date End Date Taking? Authorizing Provider  ALPRAZolam Duanne Moron) 0.5 MG tablet TAKE 1 TABLET TWICE A DAY 01/16/13   Aleksei Plotnikov V, MD  hydrOXYzine (ATARAX/VISTARIL) 25 MG tablet Take 1 tablet (25 mg total) by mouth every 8 (eight) hours as needed for itching. 08/23/12   Aleksei Plotnikov V, MD  multivitamin (BARIATRIC VIT W/EXTRA C) CHEW Chew 2 tablets by mouth daily.    Historical Provider, MD  triamcinolone cream (KENALOG) 0.5 % Apply topically 3 (three) times daily. 08/23/12   Aleksei  Plotnikov V, MD  valACYclovir (VALTREX) 500 MG tablet Take 1 tablet (500 mg total) by mouth 2 (two) times daily. 12/22/12   Aleksei Plotnikov V, MD  zolpidem (AMBIEN) 10 MG tablet TAKE 1 TABLET BY MOUTH AT BEDTIME 01/16/13   Aleksei Plotnikov V, MD   BP 104/59 mmHg  Pulse 85  Temp(Src) 97.9 F (36.6 C) (Oral)  Resp 20  Ht 5\' 3"  (1.6 m)  Wt 183 lb (83.008 kg)  BMI 32.43 kg/m2  SpO2 98% Physical Exam  Constitutional: She is oriented to person, place, and time. She appears well-developed and well-nourished. No distress.  HENT:  Head: Normocephalic and atraumatic.  Neck: Normal range of motion. Neck supple.  Cardiovascular: Normal rate and regular rhythm.  Exam reveals no gallop and no friction rub.   No murmur heard. Pulmonary/Chest: Effort normal and breath sounds normal. No respiratory distress. She has no wheezes.  Abdominal: Soft. Bowel sounds are normal. She exhibits no distension. There is no tenderness.  Musculoskeletal: Normal range of motion.  Neurological: She is alert and oriented to person, place, and time.  Skin: Skin is warm and dry. She is not diaphoretic.  Nursing note and vitals reviewed.   ED Course  Procedures (including critical care time) Labs Review Labs Reviewed  PREGNANCY, URINE  URINALYSIS, ROUTINE W REFLEX MICROSCOPIC    Imaging Review No results found.   EKG Interpretation None      MDM   Final diagnoses:  None  Symptoms and UA suggestive of a UTI. Will treat with Cipro and Pyridium and when necessary return.    Veryl Speak, MD 10/15/14 [phone]

## 2014-10-15 NOTE — ED Notes (Signed)
Patient c/o constant urination, lower back pain, constant urge to urinate

## 2014-10-15 NOTE — Discharge Instructions (Signed)
Cipro as prescribed. Pyridium as prescribed as needed for burning.  Return to the emergency department if you develop high fever, severe pain, vomiting, or other new and concerning symptoms.   Urinary Tract Infection Urinary tract infections (UTIs) can develop anywhere along your urinary tract. Your urinary tract is your body's drainage system for removing wastes and extra water. Your urinary tract includes two kidneys, two ureters, a bladder, and a urethra. Your kidneys are a pair of bean-shaped organs. Each kidney is about the size of your fist. They are located below your ribs, one on each side of your spine. CAUSES Infections are caused by microbes, which are microscopic organisms, including fungi, viruses, and bacteria. These organisms are so small that they can only be seen through a microscope. Bacteria are the microbes that most commonly cause UTIs. SYMPTOMS  Symptoms of UTIs may vary by age and gender of the patient and by the location of the infection. Symptoms in young women typically include a frequent and intense urge to urinate and a painful, burning feeling in the bladder or urethra during urination. Older women and men are more likely to be tired, shaky, and weak and have muscle aches and abdominal pain. A fever may mean the infection is in your kidneys. Other symptoms of a kidney infection include pain in your back or sides below the ribs, nausea, and vomiting. DIAGNOSIS To diagnose a UTI, your caregiver will ask you about your symptoms. Your caregiver also will ask to provide a urine sample. The urine sample will be tested for bacteria and white blood cells. White blood cells are made by your body to help fight infection. TREATMENT  Typically, UTIs can be treated with medication. Because most UTIs are caused by a bacterial infection, they usually can be treated with the use of antibiotics. The choice of antibiotic and length of treatment depend on your symptoms and the type of  bacteria causing your infection. HOME CARE INSTRUCTIONS  If you were prescribed antibiotics, take them exactly as your caregiver instructs you. Finish the medication even if you feel better after you have only taken some of the medication.  Drink enough water and fluids to keep your urine clear or pale yellow.  Avoid caffeine, tea, and carbonated beverages. They tend to irritate your bladder.  Empty your bladder often. Avoid holding urine for long periods of time.  Empty your bladder before and after sexual intercourse.  After a bowel movement, women should cleanse from front to back. Use each tissue only once. SEEK MEDICAL CARE IF:   You have back pain.  You develop a fever.  Your symptoms do not begin to resolve within 3 days. SEEK IMMEDIATE MEDICAL CARE IF:   You have severe back pain or lower abdominal pain.  You develop chills.  You have nausea or vomiting.  You have continued burning or discomfort with urination. MAKE SURE YOU:   Understand these instructions.  Will watch your condition.  Will get help right away if you are not doing well or get worse. Document Released: 09/03/2005 Document Revised: 05/25/2012 Document Reviewed: 01/02/2012 [facility] Patient Information 2015 [place], [place]. This information is not intended to replace advice given to you by your health care provider. Make sure you discuss any questions you have with your health care provider.

## 2014-10-15 NOTE — ED Notes (Signed)
Pt d/c with rx x 2 for cipro and pyridium

## 2015-04-10 ENCOUNTER — Other Ambulatory Visit: Payer: Self-pay | Admitting: Obstetrics and Gynecology

## 2015-04-11 LAB — CYTOLOGY - PAP

## 2015-06-08 ENCOUNTER — Telehealth: Payer: Self-pay | Admitting: Internal Medicine

## 2015-06-08 ENCOUNTER — Encounter: Payer: Self-pay | Admitting: Internal Medicine

## 2015-06-08 ENCOUNTER — Ambulatory Visit (INDEPENDENT_AMBULATORY_CARE_PROVIDER_SITE_OTHER): Payer: BC Managed Care – PPO | Admitting: Internal Medicine

## 2015-06-08 ENCOUNTER — Other Ambulatory Visit (INDEPENDENT_AMBULATORY_CARE_PROVIDER_SITE_OTHER): Payer: BC Managed Care – PPO

## 2015-06-08 VITALS — BP 110/72 | HR 75 | Temp 98.5°F | Ht 63.0 in | Wt 197.0 lb

## 2015-06-08 DIAGNOSIS — L819 Disorder of pigmentation, unspecified: Secondary | ICD-10-CM | POA: Diagnosis not present

## 2015-06-08 DIAGNOSIS — Z0000 Encounter for general adult medical examination without abnormal findings: Secondary | ICD-10-CM | POA: Diagnosis not present

## 2015-06-08 DIAGNOSIS — R110 Nausea: Secondary | ICD-10-CM

## 2015-06-08 DIAGNOSIS — Z23 Encounter for immunization: Secondary | ICD-10-CM | POA: Diagnosis not present

## 2015-06-08 DIAGNOSIS — E559 Vitamin D deficiency, unspecified: Secondary | ICD-10-CM

## 2015-06-08 HISTORY — DX: Nausea: R11.0

## 2015-06-08 HISTORY — DX: Disorder of pigmentation, unspecified: L81.9

## 2015-06-08 LAB — CBC WITH DIFFERENTIAL/PLATELET
BASOS PCT: 0.9 % (ref 0.0–3.0)
Basophils Absolute: 0.1 10*3/uL (ref 0.0–0.1)
EOS ABS: 0.4 10*3/uL (ref 0.0–0.7)
Eosinophils Relative: 4.1 % (ref 0.0–5.0)
HEMATOCRIT: 42.4 % (ref 36.0–46.0)
Hemoglobin: 14.1 g/dL (ref 12.0–15.0)
LYMPHS ABS: 2.7 10*3/uL (ref 0.7–4.0)
LYMPHS PCT: 31.1 % (ref 12.0–46.0)
MCHC: 33.3 g/dL (ref 30.0–36.0)
MCV: 94.2 fl (ref 78.0–100.0)
MONO ABS: 0.8 10*3/uL (ref 0.1–1.0)
Monocytes Relative: 9.1 % (ref 3.0–12.0)
NEUTROS ABS: 4.8 10*3/uL (ref 1.4–7.7)
NEUTROS PCT: 54.8 % (ref 43.0–77.0)
PLATELETS: 303 10*3/uL (ref 150.0–400.0)
RBC: 4.5 Mil/uL (ref 3.87–5.11)
RDW: 13.1 % (ref 11.5–15.5)
WBC: 8.7 10*3/uL (ref 4.0–10.5)

## 2015-06-08 LAB — HEPATIC FUNCTION PANEL
ALT: 16 U/L (ref 0–35)
AST: 17 U/L (ref 0–37)
Albumin: 3.7 g/dL (ref 3.5–5.2)
Alkaline Phosphatase: 52 U/L (ref 39–117)
BILIRUBIN DIRECT: 0.1 mg/dL (ref 0.0–0.3)
BILIRUBIN TOTAL: 0.3 mg/dL (ref 0.2–1.2)
TOTAL PROTEIN: 7.5 g/dL (ref 6.0–8.3)

## 2015-06-08 LAB — URINALYSIS
Bilirubin Urine: NEGATIVE
KETONES UR: NEGATIVE
Leukocytes, UA: NEGATIVE
NITRITE: NEGATIVE
PH: 8 (ref 5.0–8.0)
Specific Gravity, Urine: 1.02 (ref 1.000–1.030)
TOTAL PROTEIN, URINE-UPE24: NEGATIVE
URINE GLUCOSE: NEGATIVE
UROBILINOGEN UA: 0.2 (ref 0.0–1.0)

## 2015-06-08 LAB — LIPASE: Lipase: 33 U/L (ref 11.0–59.0)

## 2015-06-08 LAB — BASIC METABOLIC PANEL
BUN: 9 mg/dL (ref 6–23)
CALCIUM: 9.4 mg/dL (ref 8.4–10.5)
CO2: 28 mEq/L (ref 19–32)
CREATININE: 0.85 mg/dL (ref 0.40–1.20)
Chloride: 103 mEq/L (ref 96–112)
GFR: 95.76 mL/min (ref >60.00)
GLUCOSE: 77 mg/dL (ref 70–99)
POTASSIUM: 3.5 meq/L (ref 3.5–5.1)
SODIUM: 138 meq/L (ref 135–145)

## 2015-06-08 LAB — TSH: TSH: 0.93 u[IU]/mL (ref 0.35–4.50)

## 2015-06-08 LAB — LIPID PANEL
CHOL/HDL RATIO: 4
CHOLESTEROL: 176 mg/dL (ref 0–200)
HDL: 40.9 mg/dL (ref >39.00)
LDL CALC: 112 mg/dL — AB (ref 0–99)
NONHDL: 135.1
TRIGLYCERIDES: 114 mg/dL (ref 0.0–149.0)
VLDL: 22.8 mg/dL (ref 0.0–40.0)

## 2015-06-08 MED ORDER — KETOCONAZOLE 200 MG PO TABS: 200.0000 mg | ORAL_TABLET | Freq: Every day | ORAL | Status: DC

## 2015-06-08 MED ORDER — ZOLPIDEM TARTRATE 10 MG PO TABS: 10.0000 mg | ORAL_TABLET | Freq: Every day | ORAL | Status: DC

## 2015-06-08 NOTE — Assessment & Plan Note (Signed)
7/16 ?mirena related vs other Labs Abd [place] if not better

## 2015-06-08 NOTE — Telephone Encounter (Signed)
Ok to use Use xanax as little as possible. Use 1/2 dose Xanax when on Ketoconazole Thx

## 2015-06-08 NOTE — Progress Notes (Signed)
Pre visit review using our clinic review tool, if applicable. No additional management support is needed unless otherwise documented below in the visit note. 

## 2015-06-08 NOTE — Progress Notes (Signed)
Subjective:  Patient ID: Meghan [name], female    DOB: 02-11-1976  Age: 39 y.o. MRN: [id number]  CC: No chief complaint on file.   HPI Meghan [name] presents for well exam C/o nausea, HA - 4-5 mo (?due to mirena) off an on C/o spots w/o pigment  Outpatient Prescriptions Prior to Visit  Medication Sig Dispense Refill  . zolpidem (AMBIEN) 10 MG tablet TAKE 1 TABLET BY MOUTH AT BEDTIME 30 tablet 1  . ALPRAZolam (XANAX) 0.5 MG tablet TAKE 1 TABLET TWICE A DAY (Patient not taking: Reported on 06/08/2015) 60 tablet 1  . ciprofloxacin (CIPRO) 500 MG tablet Take 1 tablet (500 mg total) by mouth 2 (two) times daily. (Patient not taking: Reported on 06/08/2015) 10 tablet 0  . hydrOXYzine (ATARAX/VISTARIL) 25 MG tablet Take 1 tablet (25 mg total) by mouth every 8 (eight) hours as needed for itching. (Patient not taking: Reported on 06/08/2015) 60 tablet 0  . multivitamin (BARIATRIC VIT W/EXTRA C) CHEW Chew 2 tablets by mouth daily.    . phenazopyridine (PYRIDIUM) 200 MG tablet Take 1 tablet (200 mg total) by mouth 3 (three) times daily as needed for pain. (Patient not taking: Reported on 06/08/2015) 6 tablet 0  . triamcinolone cream (KENALOG) 0.5 % Apply topically 3 (three) times daily. (Patient not taking: Reported on 06/08/2015) 120 g 1  . valACYclovir (VALTREX) 500 MG tablet Take 1 tablet (500 mg total) by mouth 2 (two) times daily. (Patient not taking: Reported on 06/08/2015) 10 tablet 0   Facility-Administered Medications Prior to Visit  Medication Dose Route Frequency Provider Last Rate Last Dose  . promethazine (PHENERGAN) injection 50 mg  50 mg Intramuscular Q6H PRN Eddye Broxterman V, MD   50 mg at 06/02/11 1822    ROS Review of Systems  Constitutional: Negative for chills, activity change, appetite change, fatigue and unexpected weight change.  HENT: Negative for congestion, mouth sores and sinus pressure.   Eyes: Negative for visual disturbance.  Respiratory: Negative for cough and chest  tightness.   Gastrointestinal: Positive for nausea. Negative for abdominal pain.  Genitourinary: Negative for frequency, difficulty urinating and vaginal pain.  Musculoskeletal: Negative for back pain and gait problem.  Skin: Negative for pallor and rash.  Neurological: Positive for headaches. Negative for dizziness, tremors, weakness and numbness.  Psychiatric/Behavioral: Negative for suicidal ideas, confusion and sleep disturbance. The patient is not nervous/anxious.     Objective:  BP 110/72 mmHg  Pulse 75  Temp(Src) 98.5 F (36.9 C) (Oral)  Ht 5\' 3"  (1.6 m)  Wt 197 lb (89.359 kg)  BMI 34.91 kg/m2  SpO2 97%  BP Readings from Last 3 Encounters:  06/08/15 110/72  10/15/14 104/59  09/02/12 126/80    Wt Readings from Last 3 Encounters:  06/08/15 197 lb (89.359 kg)  10/15/14 183 lb (83.008 kg)  09/02/12 174 lb (78.926 kg)    Physical Exam  Constitutional: She appears well-developed. No distress.  Obese   HENT:  Head: Normocephalic.  Right Ear: External ear normal.  Left Ear: External ear normal.  Nose: Nose normal.  Mouth/Throat: Oropharynx is clear and moist.  Eyes: Conjunctivae are normal. Pupils are equal, round, and reactive to light. Right eye exhibits no discharge. Left eye exhibits no discharge.  Neck: Normal range of motion. Neck supple. No JVD present. No tracheal deviation present. No thyromegaly present.  Cardiovascular: Normal rate, regular rhythm and normal heart sounds.   Pulmonary/Chest: No stridor. No respiratory distress. She has no wheezes.  Abdominal: Soft. Bowel sounds are normal. She exhibits no distension and no mass. There is no tenderness. There is no rebound and no guarding.  Musculoskeletal: She exhibits no edema or tenderness.  Lymphadenopathy:    She has no cervical adenopathy.  Neurological: She displays normal reflexes. No cranial nerve deficit. She exhibits normal muscle tone. Coordination normal.  Skin: No rash noted. No erythema.    Psychiatric: She has a normal mood and affect. Her behavior is normal. Judgment and thought content normal.   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  Assessment & Plan:   Diagnoses and all orders for this visit:  Well adult exam Orders: -     TSH; Future -     Vitamin B12; Future -     Vit D  25 hydroxy (rtn osteoporosis monitoring); Future -     Urinalysis; Future -     Lipid panel; Future -     Lipase; Future -     Hepatic function panel; Future -     CBC with Differential/Platelet; Future -     Basic metabolic panel; Future  Nausea without vomiting Orders: -     Vitamin B12; Future -     Vit D  25 hydroxy (rtn osteoporosis monitoring); Future -     Lipase; Future  Hypopigmentation  Need for Tdap vaccination Orders: -     Tdap vaccine greater than or equal to 7yo IM  Vitamin D deficiency  Other orders -     zolpidem (AMBIEN) 10 MG tablet; Take 1 tablet (10 mg total) by mouth at bedtime. -     ketoconazole (NIZORAL) 200 MG tablet; Take 1 tablet (200 mg total) by mouth daily.   I have changed Ms. Bednarz's zolpidem. I am also having her start on ketoconazole. Additionally, I am having her maintain her multivitamin, hydrOXYzine, triamcinolone cream, valACYclovir, ALPRAZolam, ciprofloxacin, phenazopyridine, alprazolam, and levonorgestrel. We will continue to administer promethazine.  Meds ordered this encounter  Medications  . alprazolam (XANAX) 2 MG tablet    Sig: TAKE 1 TABLET BY MOUTH EVERY 8 HOURS AS NEEDED FOR  ANXIETY (LAST FILL WM FRIENDLY 5/29)    Refill:  3  . levonorgestrel (MIRENA) 20 MCG/24HR IUD    Sig: 1 each by Intrauterine route once.  Marland Kitchen zolpidem (AMBIEN) 10 MG tablet    Sig: Take 1 tablet (10 mg total) by mouth at bedtime.    Dispense:  30 tablet    Refill:  2  . ketoconazole (NIZORAL) 200 MG tablet    Sig: Take 1 tablet (200 mg total) by mouth daily.    Dispense:  10 tablet    Refill:  0     Follow-up: Return in about 6 months (around 12/09/2015) for a follow-up visit.  Walker Kehr, MD

## 2015-06-08 NOTE — Patient Instructions (Signed)
   Milk free trial (no milk, ice cream, cheese and yogurt) for 4-6 weeks. OK to use almond, coconut, rice or soy milk. "Almond breeze" brand tastes good.  

## 2015-06-08 NOTE — Telephone Encounter (Signed)
Pharmacy called and her new medication ketoconazole (NIZORAL) 200 MG tablet [[id number] has an interation with her xanax Please advise pharmacy

## 2015-06-08 NOTE — Assessment & Plan Note (Signed)
Ketoconazole po

## 2015-06-09 LAB — VITAMIN B12: VITAMIN B 12: 239 pg/mL (ref 211–911)

## 2015-06-09 LAB — VITAMIN D 25 HYDROXY (VIT D DEFICIENCY, FRACTURES): VITD: 9.46 ng/mL — AB (ref 30.00–100.00)

## 2015-06-12 ENCOUNTER — Encounter: Payer: Self-pay | Admitting: Internal Medicine

## 2015-06-12 DIAGNOSIS — Z0000 Encounter for general adult medical examination without abnormal findings: Secondary | ICD-10-CM | POA: Insufficient documentation

## 2015-06-12 DIAGNOSIS — E559 Vitamin D deficiency, unspecified: Secondary | ICD-10-CM

## 2015-06-12 HISTORY — DX: Vitamin D deficiency, unspecified: E55.9

## 2015-06-12 HISTORY — DX: Encounter for general adult medical examination without abnormal findings: Z00.00

## 2015-06-12 MED ORDER — VITAMIN D3 50 MCG (2000 UT) PO CAPS: 2000.0000 [IU] | ORAL_CAPSULE | Freq: Every day | ORAL | Status: DC

## 2015-06-12 MED ORDER — ERGOCALCIFEROL 1.25 MG (50000 UT) PO CAPS
50000.0000 [IU] | ORAL_CAPSULE | ORAL | Status: DC
Start: 2015-06-12 — End: 2015-07-13

## 2015-06-12 MED ORDER — VITAMIN B-12 500 MCG SL SUBL: SUBLINGUAL_TABLET | SUBLINGUAL | Status: DC

## 2015-06-12 NOTE — Telephone Encounter (Signed)
Pharmacy informed. Left mess for patient to call back.

## 2015-06-12 NOTE — Telephone Encounter (Signed)
Patient informed. 

## 2015-06-12 NOTE — Assessment & Plan Note (Signed)
Start Vit D

## 2015-06-12 NOTE — Assessment & Plan Note (Signed)
We discussed age appropriate health related issues, including available/recomended screening tests and vaccinations. We discussed a need for adhering to healthy diet and exercise. Labs/EKG were reviewed/ordered. All questions were answered.   

## 2015-07-13 ENCOUNTER — Other Ambulatory Visit: Payer: Self-pay | Admitting: Internal Medicine

## 2015-08-30 NOTE — H&P (Signed)
  39 year old G 2 P 2 with retained IUD. She desires permanent sterilization.  She complains of heavy menstrual bleeding.    Past Medical History  Diagnosis Date  . Anxiety    Past Surgical History  Procedure Laterality Date  . Incise and drain abcess      back  . Cesarean section     .allergies NKDA   Family History  Problem Relation Age of Onset  . Hypertension Father    Social History   Social History  . Marital Status: Single    Spouse Name: N/A  . Number of Children: N/A  . Years of Education: N/A   Occupational History  . Not on file.   Social History Main Topics  . Smoking status: Never Smoker   . Smokeless tobacco: Not on file  . Alcohol Use: No  . Drug Use: No  . Sexual Activity: Not on file   Other Topics Concern  . Not on file   Social History Narrative   There were no vitals taken for this visit. General alert and oriented Lung CTAB Car RRR Abdomen is soft and non tender Pelvic is normal Ultrasound confirms retained IUD and intramural fibroids  IMPRESSSION: Retained IUD Desires permanent sterilization Menorrhagia  PLAN: D and C, Hysteroscopy, HTA, IUD Removal LSC BTL Risks reviewed Consent signed

## 2015-08-31 ENCOUNTER — Encounter (HOSPITAL_COMMUNITY): Payer: Self-pay

## 2015-08-31 ENCOUNTER — Encounter (HOSPITAL_COMMUNITY)
Admission: RE | Admit: 2015-08-31 | Discharge: 2015-08-31 | Disposition: A | Discharge status: Home / Self Care | Payer: BC Managed Care – PPO | Source: Ambulatory Visit | Attending: Obstetrics and Gynecology | Admitting: Obstetrics and Gynecology

## 2015-08-31 DIAGNOSIS — Z01818 Encounter for other preprocedural examination: Secondary | ICD-10-CM | POA: Diagnosis present

## 2015-08-31 HISTORY — DX: Deficiency of other specified B group vitamins: E53.8

## 2015-08-31 LAB — CBC
HEMATOCRIT: 42.8 % (ref 36.0–46.0)
Hemoglobin: 14.4 g/dL (ref 12.0–15.0)
MCH: 31.7 pg (ref 26.0–34.0)
MCHC: 33.6 g/dL (ref 30.0–36.0)
MCV: 94.3 fL (ref 78.0–100.0)
Platelets: 292 10*3/uL (ref 150–400)
RBC: 4.54 MIL/uL (ref 3.87–5.11)
RDW: 13.1 % (ref 11.5–15.5)
WBC: 6.5 10*3/uL (ref 4.0–10.5)

## 2015-08-31 LAB — TYPE AND SCREEN
ABO/RH(D): A POS
Antibody Screen: NEGATIVE

## 2015-08-31 LAB — ABO/RH: ABO/RH(D): A POS

## 2015-08-31 NOTE — Patient Instructions (Signed)
Your procedure is scheduled on:09/07/15  Enter through the Main Entrance at :[place] up desk phone and dial [phone] and inform us of your arrival.  Please call [phone] if you have any problems the morning of surgery.  Remember: Do not eat food or drink liquids, including water, after midnight:Thursday    You may brush your teeth the morning of surgery.  Take these meds the morning of surgery with a sip of water:Xanax if needed  DO NOT wear jewelry, eye make-up, lipstick,body lotion, or dark fingernail polish.  (Polished toes are ok) You may wear deodorant.  If you are to be admitted after surgery, leave suitcase in car until your room has been assigned. Patients discharged on the day of surgery will not be allowed to drive home. Wear loose fitting, comfortable clothes for your ride home.

## 2015-09-06 ENCOUNTER — Encounter (HOSPITAL_COMMUNITY): Payer: Self-pay | Admitting: Anesthesiology

## 2015-09-06 NOTE — Anesthesia Preprocedure Evaluation (Signed)
Anesthesia Evaluation  Patient identified by MRN, date of birth, ID band Patient awake    Reviewed: Allergy & Precautions, H&P , NPO status , Patient's Chart, lab work & pertinent test results  Airway Mallampati: II  TM Distance: >3 FB Neck ROM: full    Dental no notable dental hx. (+) Teeth Intact   Pulmonary neg pulmonary ROS,    Pulmonary exam normal        Cardiovascular negative cardio ROS Normal cardiovascular exam     Neuro/Psych negative neurological ROS     GI/Hepatic negative GI ROS, Neg liver ROS,   Endo/Other  negative endocrine ROS  Renal/GU negative Renal ROS     Musculoskeletal   Abdominal   Peds  Hematology negative hematology ROS (+)   Anesthesia Other Findings   Reproductive/Obstetrics negative OB ROS                             Anesthesia Physical Anesthesia Plan  ASA: II  Anesthesia Plan: General   Post-op Pain Management:    Induction: Intravenous  Airway Management Planned: Oral ETT  Additional Equipment:   Intra-op Plan:   Post-operative Plan: Extubation in OR  Informed Consent:   Plan Discussed with:   Anesthesia Plan Comments:         Anesthesia Quick Evaluation

## 2015-09-07 ENCOUNTER — Encounter (HOSPITAL_COMMUNITY)
Admission: RE | Disposition: A | Discharge status: Home / Self Care | Payer: Self-pay | Source: Ambulatory Visit | Attending: Obstetrics and Gynecology

## 2015-09-07 ENCOUNTER — Ambulatory Visit (HOSPITAL_COMMUNITY): Payer: BC Managed Care – PPO | Admitting: Anesthesiology

## 2015-09-07 ENCOUNTER — Encounter (HOSPITAL_COMMUNITY): Payer: Self-pay

## 2015-09-07 ENCOUNTER — Ambulatory Visit (HOSPITAL_COMMUNITY)
Admission: RE | Admit: 2015-09-07 | Discharge: 2015-09-07 | Disposition: A | Discharge status: Home / Self Care | Payer: BC Managed Care – PPO | Source: Ambulatory Visit | Attending: Obstetrics and Gynecology | Admitting: Obstetrics and Gynecology

## 2015-09-07 DIAGNOSIS — Z30432 Encounter for removal of intrauterine contraceptive device: Secondary | ICD-10-CM | POA: Insufficient documentation

## 2015-09-07 DIAGNOSIS — Z302 Encounter for sterilization: Secondary | ICD-10-CM | POA: Insufficient documentation

## 2015-09-07 DIAGNOSIS — N920 Excessive and frequent menstruation with regular cycle: Secondary | ICD-10-CM | POA: Diagnosis not present

## 2015-09-07 DIAGNOSIS — F419 Anxiety disorder, unspecified: Secondary | ICD-10-CM | POA: Insufficient documentation

## 2015-09-07 HISTORY — PX: IUD REMOVAL: SHX5392

## 2015-09-07 HISTORY — PX: DILITATION & CURRETTAGE/HYSTROSCOPY WITH HYDROTHERMAL ABLATION: SHX5570

## 2015-09-07 HISTORY — PX: LAPAROSCOPIC TUBAL LIGATION: SHX1937

## 2015-09-07 LAB — PREGNANCY, URINE: PREG TEST UR: NEGATIVE

## 2015-09-07 SURGERY — REMOVAL, INTRAUTERINE DEVICE
Anesthesia: General

## 2015-09-07 MED ORDER — LIDOCAINE HCL 1 % IJ SOLN
INTRAMUSCULAR | Status: AC
Start: 2015-09-07 — End: 2015-09-07
  Filled 2015-09-07: qty 20

## 2015-09-07 MED ORDER — FENTANYL CITRATE (PF) 100 MCG/2ML IJ SOLN
INTRAMUSCULAR | Status: DC | PRN
  Administered 2015-09-07: 100 ug via INTRAVENOUS
  Administered 2015-09-07 (×2): 50 ug via INTRAVENOUS

## 2015-09-07 MED ORDER — ONDANSETRON HCL 4 MG/2ML IJ SOLN: 4.0000 mg | Freq: Once | INTRAMUSCULAR | Status: DC | PRN

## 2015-09-07 MED ORDER — SCOPOLAMINE 1 MG/3DAYS TD PT72
1.0000 | MEDICATED_PATCH | Freq: Once | TRANSDERMAL | Status: DC
  Administered 2015-09-07: 1.5 mg via TRANSDERMAL

## 2015-09-07 MED ORDER — KETOROLAC TROMETHAMINE 30 MG/ML IJ SOLN: 30.0000 mg | Freq: Once | INTRAMUSCULAR | Status: DC | PRN

## 2015-09-07 MED ORDER — OXYCODONE-ACETAMINOPHEN 5-325 MG PO TABS
1.0000 | ORAL_TABLET | ORAL | Status: DC | PRN
  Administered 2015-09-07: 1 via ORAL

## 2015-09-07 MED ORDER — FENTANYL CITRATE (PF) 100 MCG/2ML IJ SOLN
INTRAMUSCULAR | Status: AC
  Administered 2015-09-07: 50 ug via INTRAVENOUS
  Filled 2015-09-07: qty 2

## 2015-09-07 MED ORDER — SCOPOLAMINE 1 MG/3DAYS TD PT72
MEDICATED_PATCH | TRANSDERMAL | Status: AC
  Administered 2015-09-07: 1.5 mg via TRANSDERMAL
  Filled 2015-09-07: qty 1

## 2015-09-07 MED ORDER — FENTANYL CITRATE (PF) 100 MCG/2ML IJ SOLN
INTRAMUSCULAR | Status: AC
Start: 2015-09-07 — End: 2015-09-07
  Filled 2015-09-07: qty 4

## 2015-09-07 MED ORDER — CEFAZOLIN SODIUM-DEXTROSE 2-3 GM-% IV SOLR
2.0000 g | INTRAVENOUS | Status: AC
  Administered 2015-09-07: 2 g via INTRAVENOUS

## 2015-09-07 MED ORDER — SUCCINYLCHOLINE CHLORIDE 20 MG/ML IJ SOLN
INTRAMUSCULAR | Status: AC
  Filled 2015-09-07: qty 1

## 2015-09-07 MED ORDER — ONDANSETRON HCL 4 MG/2ML IJ SOLN
INTRAMUSCULAR | Status: AC
  Filled 2015-09-07: qty 2

## 2015-09-07 MED ORDER — IBUPROFEN 600 MG PO TABS: 600.0000 mg | ORAL_TABLET | Freq: Four times a day (QID) | ORAL | Status: DC | PRN

## 2015-09-07 MED ORDER — 0.9 % SODIUM CHLORIDE (POUR BTL) OPTIME
TOPICAL | Status: DC | PRN
  Administered 2015-09-07: 1000 mL

## 2015-09-07 MED ORDER — PROPOFOL 10 MG/ML IV BOLUS
INTRAVENOUS | Status: DC | PRN
  Administered 2015-09-07: 200 mg via INTRAVENOUS

## 2015-09-07 MED ORDER — OXYCODONE-ACETAMINOPHEN 10-325 MG PO TABS: 1.0000 | ORAL_TABLET | ORAL | Status: DC | PRN

## 2015-09-07 MED ORDER — FENTANYL CITRATE (PF) 100 MCG/2ML IJ SOLN
INTRAMUSCULAR | Status: AC
  Filled 2015-09-07: qty 2

## 2015-09-07 MED ORDER — SODIUM CHLORIDE 0.9 % IJ SOLN
INTRAMUSCULAR | Status: DC | PRN
  Administered 2015-09-07: 10 mL

## 2015-09-07 MED ORDER — PROPOFOL 10 MG/ML IV BOLUS
INTRAVENOUS | Status: AC
  Filled 2015-09-07: qty 20

## 2015-09-07 MED ORDER — LACTATED RINGERS IV SOLN
INTRAVENOUS | Status: DC
  Administered 2015-09-07 (×2): via INTRAVENOUS

## 2015-09-07 MED ORDER — LIDOCAINE HCL (CARDIAC) 20 MG/ML IV SOLN
INTRAVENOUS | Status: AC
  Filled 2015-09-07: qty 5

## 2015-09-07 MED ORDER — MIDAZOLAM HCL 2 MG/2ML IJ SOLN
INTRAMUSCULAR | Status: AC
  Filled 2015-09-07: qty 4

## 2015-09-07 MED ORDER — SODIUM CHLORIDE 0.9 % IR SOLN
Status: DC | PRN
  Administered 2015-09-07: 3000 mL

## 2015-09-07 MED ORDER — CEFAZOLIN SODIUM-DEXTROSE 2-3 GM-% IV SOLR
INTRAVENOUS | Status: AC
  Filled 2015-09-07: qty 50

## 2015-09-07 MED ORDER — LACTATED RINGERS IV SOLN: INTRAVENOUS | Status: DC

## 2015-09-07 MED ORDER — OXYCODONE-ACETAMINOPHEN 5-325 MG PO TABS
ORAL_TABLET | ORAL | Status: AC
  Filled 2015-09-07: qty 1

## 2015-09-07 MED ORDER — OXYCODONE HCL 5 MG/5ML PO SOLN: 5.0000 mg | Freq: Once | ORAL | Status: DC | PRN

## 2015-09-07 MED ORDER — SODIUM CHLORIDE 0.9 % IJ SOLN
INTRAMUSCULAR | Status: AC
  Filled 2015-09-07: qty 10

## 2015-09-07 MED ORDER — KETOROLAC TROMETHAMINE 30 MG/ML IJ SOLN
INTRAMUSCULAR | Status: DC | PRN
  Administered 2015-09-07: 30 mg via INTRAVENOUS

## 2015-09-07 MED ORDER — ONDANSETRON HCL 4 MG/2ML IJ SOLN
INTRAMUSCULAR | Status: DC | PRN
  Administered 2015-09-07: 4 mg via INTRAVENOUS

## 2015-09-07 MED ORDER — DEXAMETHASONE SODIUM PHOSPHATE 10 MG/ML IJ SOLN
INTRAMUSCULAR | Status: DC | PRN
  Administered 2015-09-07: 10 mg via INTRAVENOUS

## 2015-09-07 MED ORDER — FENTANYL CITRATE (PF) 100 MCG/2ML IJ SOLN
INTRAMUSCULAR | Status: AC
  Filled 2015-09-07: qty 4

## 2015-09-07 MED ORDER — FENTANYL CITRATE (PF) 100 MCG/2ML IJ SOLN
25.0000 ug | INTRAMUSCULAR | Status: DC | PRN
  Administered 2015-09-07 (×2): 25 ug via INTRAVENOUS
  Administered 2015-09-07 (×2): 50 ug via INTRAVENOUS

## 2015-09-07 MED ORDER — MIDAZOLAM HCL 2 MG/2ML IJ SOLN
INTRAMUSCULAR | Status: DC | PRN
  Administered 2015-09-07: 2 mg via INTRAVENOUS

## 2015-09-07 MED ORDER — SUCCINYLCHOLINE CHLORIDE 20 MG/ML IJ SOLN
INTRAMUSCULAR | Status: DC | PRN
  Administered 2015-09-07: 140 mg via INTRAVENOUS

## 2015-09-07 MED ORDER — LIDOCAINE HCL (CARDIAC) 20 MG/ML IV SOLN
INTRAVENOUS | Status: DC | PRN
  Administered 2015-09-07: 100 mg via INTRAVENOUS

## 2015-09-07 MED ORDER — OXYCODONE HCL 5 MG PO TABS: 5.0000 mg | ORAL_TABLET | Freq: Once | ORAL | Status: DC | PRN

## 2015-09-07 MED ORDER — MEPERIDINE HCL 25 MG/ML IJ SOLN: 6.2500 mg | INTRAMUSCULAR | Status: DC | PRN

## 2015-09-07 MED ORDER — BUPIVACAINE HCL (PF) 0.25 % IJ SOLN
INTRAMUSCULAR | Status: AC
  Filled 2015-09-07: qty 30

## 2015-09-07 MED ORDER — FERRIC SUBSULFATE 259 MG/GM EX SOLN
CUTANEOUS | Status: AC
  Filled 2015-09-07: qty 8

## 2015-09-07 MED ORDER — DEXAMETHASONE SODIUM PHOSPHATE 10 MG/ML IJ SOLN
INTRAMUSCULAR | Status: AC
  Filled 2015-09-07: qty 1

## 2015-09-07 SURGICAL SUPPLY — 35 items
ADH SKN CLS APL DERMABOND .7 (GAUZE/BANDAGES/DRESSINGS) ×2
CANISTER SUCT 3000ML (MISCELLANEOUS) ×4 IMPLANT
CATH ROBINSON RED A/P 16FR (CATHETERS) ×4 IMPLANT
CHLORAPREP W/TINT 26ML (MISCELLANEOUS) ×4 IMPLANT
CLOTH BEACON ORANGE TIMEOUT ST (SAFETY) ×4 IMPLANT
CONTAINER PREFILL 10% NBF 60ML (FORM) ×8 IMPLANT
DERMABOND ADVANCED (GAUZE/BANDAGES/DRESSINGS) ×2
DERMABOND ADVANCED .7 DNX12 (GAUZE/BANDAGES/DRESSINGS) IMPLANT
DRSG COVADERM PLUS 2X2 (GAUZE/BANDAGES/DRESSINGS) ×8 IMPLANT
DRSG OPSITE POSTOP 3X4 (GAUZE/BANDAGES/DRESSINGS) ×2 IMPLANT
ELECT REM PT RETURN 9FT ADLT (ELECTROSURGICAL) ×4
ELECTRODE REM PT RTRN 9FT ADLT (ELECTROSURGICAL) ×2 IMPLANT
GLOVE BIO SURGEON STRL SZ 6.5 (GLOVE) ×3 IMPLANT
GLOVE BIO SURGEONS STRL SZ 6.5 (GLOVE) ×1
GOWN STRL REUS W/TWL LRG LVL3 (GOWN DISPOSABLE) ×8 IMPLANT
LIQUID BAND (GAUZE/BANDAGES/DRESSINGS) ×4 IMPLANT
LOOP ANGLED CUTTING 22FR (CUTTING LOOP) IMPLANT
NDL SPNL 22GX3.5 QUINCKE BK (NEEDLE) ×2 IMPLANT
NEEDLE INSUFFLATION 120MM (ENDOMECHANICALS) ×4 IMPLANT
NEEDLE SPNL 22GX3.5 QUINCKE BK (NEEDLE) ×4 IMPLANT
PACK LAPAROSCOPY BASIN (CUSTOM PROCEDURE TRAY) ×4 IMPLANT
PACK VAGINAL MINOR WOMEN LF (CUSTOM PROCEDURE TRAY) ×4 IMPLANT
PAD OB MATERNITY 4.3X12.25 (PERSONAL CARE ITEMS) ×4 IMPLANT
PAD POSITIONING PINK XL (MISCELLANEOUS) ×4 IMPLANT
PAD PREP 24X48 CUFFED NSTRL (MISCELLANEOUS) ×4 IMPLANT
SCOPETTES 8  STERILE (MISCELLANEOUS)
SCOPETTES 8 STERILE (MISCELLANEOUS) IMPLANT
SET GENESYS HTA PROCERVA (MISCELLANEOUS) ×4 IMPLANT
SUT VIC AB 3-0 PS2 18 (SUTURE)
SUT VIC AB 3-0 PS2 18XBRD (SUTURE) IMPLANT
SUT VICRYL 0 UR6 27IN ABS (SUTURE) ×4 IMPLANT
TOWEL OR 17X24 6PK STRL BLUE (TOWEL DISPOSABLE) ×8 IMPLANT
TROCAR XCEL DIL TIP R 11M (ENDOMECHANICALS) ×6 IMPLANT
WARMER LAPAROSCOPE (MISCELLANEOUS) ×4 IMPLANT
WATER STERILE IRR 1000ML POUR (IV SOLUTION) ×4 IMPLANT

## 2015-09-07 NOTE — Anesthesia Procedure Notes (Signed)
Procedure Name: Intubation Date/Time: 09/07/2015 7:47 AM Performed by: Riki Sheer Pre-anesthesia Checklist: Patient identified, Emergency Drugs available, Suction available, Patient being monitored and Timeout performed Patient Re-evaluated:Patient Re-evaluated prior to inductionOxygen Delivery Method: Circle system utilized Preoxygenation: Pre-oxygenation with 100% oxygen Intubation Type: IV induction Ventilation: Mask ventilation without difficulty Laryngoscope Size: Rybarczyk and 2 Grade View: Grade I Tube type: Oral Tube size: 7.0 mm Number of attempts: 1 Airway Equipment and Method: Stylet Placement Confirmation: ETT inserted through vocal cords under direct vision,  positive ETCO2,  CO2 detector and breath sounds checked- equal and bilateral Secured at: 21 cm Tube secured with: Tape Dental Injury: Teeth and Oropharynx as per pre-operative assessment

## 2015-09-07 NOTE — Progress Notes (Signed)
H and P on the chart No significant changes Proceed with   IUD Removal LSC BTL HTA

## 2015-09-07 NOTE — Brief Op Note (Signed)
09/07/2015  8:53 AM  PATIENT:  Meghan [name]  39 y.o. female  PRE-OPERATIVE DIAGNOSIS:  Desires Permanent sterilization Retained IUD Menorrhagia  POST-OPERATIVE DIAGNOSIS:   Same  PROCEDURE:  Procedure(s): INTRAUTERINE DEVICE (IUD) REMOVAL (N/A) DILATATION & CURETTAGE/HYSTEROSCOPY WITH HYDROTHERMAL ABLATION (N/A) LAPAROSCOPIC TUBAL LIGATION (Bilateral)  SURGEON:  Surgeon(s) and Role:    * Dian Queen, MD - Primary  PHYSICIAN ASSISTANT:   ASSISTANTS: none   ANESTHESIA:   general  EBL:  Total I/O In: 1300 [I.V.:1300] Out: 50 [Urine:50]  BLOOD ADMINISTERED:none  DRAINS: none   LOCAL MEDICATIONS USED:  NONE  SPECIMEN:  No Specimen  DISPOSITION OF SPECIMEN:  N/A  COUNTS:  YES  TOURNIQUET:  * No tourniquets in log *  DICTATION: .Other Dictation: Dictation Number [id number]  PLAN OF CARE: Discharge to home after PACU  PATIENT DISPOSITION:  PACU - hemodynamically stable.   Delay start of Pharmacological VTE agent (>24hrs) due to surgical blood loss or risk of bleeding: not applicable

## 2015-09-07 NOTE — Discharge Instructions (Signed)
DISCHARGE INSTRUCTIONS: Laparoscopy  The following instructions have been prepared to help you care for yourself upon your return home today.  Wound care:  Do not get the incision wet for the first 24 hours. The incision should be kept clean and dry.  The Band-Aids or dressings may be removed the day after surgery.  Should the incision become sore, red, and swollen after the first week, check with your doctor.  Personal hygiene:  Shower the day after your procedure.  Activity and limitations:  Do NOT drive or operate any equipment today.  Do NOT lift anything more than 15 pounds for 2-3 weeks after surgery.  Do NOT rest in bed all day.  Walking is encouraged. Walk each day, starting slowly with 5-minute walks 3 or 4 times a day. Slowly increase the length of your walks.  Walk up and down stairs slowly.  Do NOT do strenuous activities, such as golfing, playing tennis, bowling, running, biking, weight lifting, gardening, mowing, or vacuuming for 2-4 weeks. Ask your doctor when it is okay to start.  Diet: Eat a light meal as desired this evening. You may resume your usual diet tomorrow.  Return to work: This is dependent on the type of work you do. For the most part you can return to a desk job within a week of surgery. If you are more active at work, please discuss this with your doctor.  What to expect after your surgery: You may have a slight burning sensation when you urinate on the first day. You may have a very small amount of blood in the urine. Expect to have a small amount of vaginal discharge/light bleeding for 1-2 weeks. It is not unusual to have abdominal soreness and bruising for up to 2 weeks. You may be tired and need more rest for about 1 week. You may experience shoulder pain for 24-72 hours. Lying flat in bed may relieve it.  Call your doctor for any of the following:  Develop a fever of 100.4 or greater  Inability to urinate 6 hours after discharge from  hospital  Severe pain not relieved by pain medications  Persistent of heavy bleeding at incision site  Redness or swelling around incision site after a week  Increasing nausea or vomiting  Patient Signature________________________________________ Nurse Signature_________________________________________DISCHARGE INSTRUCTIONS: HYSTEROSCOPY / ENDOMETRIAL ABLATION The following instructions have been prepared to help you care for yourself upon your return home.  May Remove Scop patch on or before  May take Ibuprofen after  May take stool softner while taking narcotic pain medication to prevent constipation.  Drink plenty of water.  Personal hygiene:  Use sanitary pads for vaginal drainage, not tampons.  Shower the day after your procedure.  NO tub baths, pools or Jacuzzis for 2-3 weeks.  Wipe front to back after using the bathroom.  Activity and limitations:  Do NOT drive or operate any equipment for 24 hours. The effects of anesthesia are still present and drowsiness may result.  Do NOT rest in bed all day.  Walking is encouraged.  Walk up and down stairs slowly.  You may resume your normal activity in one to two days or as indicated by your physician. Sexual activity: NO intercourse for at least 2 weeks after the procedure, or as indicated by your Doctor.  Diet: Eat a light meal as desired this evening. You may resume your usual diet tomorrow.  Return to Work: You may resume your work activities in one to two days or as indicated  by your Doctor.  What to expect after your surgery: Expect to have vaginal bleeding/discharge for 2-3 days and spotting for up to 10 days. It is not unusual to have soreness for up to 1-2 weeks. You may have a slight burning sensation when you urinate for the first day. Mild cramps may continue for a couple of days. You may have a regular period in 2-6 weeks.  Call your doctor for any of the following:  Excessive vaginal bleeding or  clotting, saturating and changing one pad every hour.  Inability to urinate 6 hours after discharge from hospital.  Pain not relieved by pain medication.  Fever of 100.4 F or greater.  Unusual vaginal discharge or odor.  Return to office _________________Call for an appointment ___________________ Patients signature: ______________________ Nurses signature ________________________  [facility] [phone]

## 2015-09-07 NOTE — Anesthesia Postprocedure Evaluation (Signed)
Anesthesia Post Note  Patient: Meghan [name]  Procedure(s) Performed: Procedure(s) (LRB): INTRAUTERINE DEVICE (IUD) REMOVAL (N/A) DILATATION & CURETTAGE/HYSTEROSCOPY WITH HYDROTHERMAL ABLATION (N/A) LAPAROSCOPIC TUBAL LIGATION (Bilateral)  Anesthesia type: General  Patient location: PACU  Post pain: Pain level controlled  Post assessment: Post-op Vital signs reviewed  Last Vitals:  Filed Vitals:   09/07/15 0945  BP: 118/68  Pulse: 69  Temp:   Resp: 20    Post vital signs: Reviewed  Level of consciousness: sedated  Complications: No apparent anesthesia complications

## 2015-09-07 NOTE — Transfer of Care (Signed)
Immediate Anesthesia Transfer of Care Note  Patient: Dionicio Stall  Procedure(s) Performed: Procedure(s): INTRAUTERINE DEVICE (IUD) REMOVAL (N/A) DILATATION & CURETTAGE/HYSTEROSCOPY WITH HYDROTHERMAL ABLATION (N/A) LAPAROSCOPIC TUBAL LIGATION (Bilateral)  Patient Location: PACU  Anesthesia Type:General  Level of Consciousness: awake, alert  and oriented  Airway & Oxygen Therapy: Patient Spontanous Breathing and Patient connected to nasal cannula oxygen  Post-op Assessment: Report given to RN and Post -op Vital signs reviewed and stable  Post vital signs: Reviewed and stable  Last Vitals:  Filed Vitals:   09/07/15 0902  BP:   Pulse:   Temp: 36.7 C  Resp:     Complications: No apparent anesthesia complications

## 2015-09-08 NOTE — Op Note (Signed)
NAMETABBETHA, KUTSCHER                ACCOUNT NO.:  [id number]  MEDICAL RECORD NO.:  [id number]  LOCATION:  [facility]                          FACILITY:  [facility]  PHYSICIAN:  Michelle L. Grewal, M.D.DATE OF BIRTH:  [birth year]  DATE OF PROCEDURE:  09/07/2015 DATE OF DISCHARGE:  09/07/2015                              OPERATIVE REPORT   PREOPERATIVE DIAGNOSES: 1. Retained intrauterine device. 2. Desires permanent sterilization. 3. Menorrhagia.  POSTOPERATIVE DIAGNOSES: 1. Retained intrauterine device. 2. Desires permanent sterilization. 3. Menorrhagia.  PROCEDURES: 1. Intrauterine device removal. 2. Laparoscopic tubal ligation. 3. Hydrothermal endometrial ablation.  SURGEON:  Michelle L. Helane Rima, M.D.  ANESTHESIA:  General.  ESTIMATED BLOOD LOSS:  Minimal.  COMPLICATIONS:  None.  DRAINS:  None.  PATHOLOGY:  None except for IUD.  DESCRIPTION OF PROCEDURE:  The patient was taken to the operating room and she was then intubated.  She was prepped and draped.  An in-and-out catheter was used to empty the bladder.  The cervix was grasped with a tenaculum and polyp forceps were inserted and the IUD was removed.  It was sent to pathology.  A uterine manipulator was inserted.  Attention was turned to the abdomen where a small infraumbilical incision was made.  The Veress needle was inserted.  Pneumoperitoneum was performed. The trocar was inserted and the patient was placed in Trendelenburg position.  The laparoscope was introduced through the trocar sheath, it revealed that she had some adhesions involving the anterior surface of the uterus to the anterior abdominal wall.  Adnexa were unremarkable. We used a long Kleppinger and grasped left fallopian tube.  We then performed tubal ligation using the Kleppinger with the triple-burn technique. We then went over to the right tube which appeared to be clubbed and shortened and performed the same thing.  I could see the fimbriated end on  both sides.  After we inspected the pelvis, we then removed the scope, released the pneumoperitoneum, removed the trocar, closed the skin with 2-0 Vicryl interrupted.  Dermabond was applied.  We then went down to the vagina where a speculum was inserted into the vagina.  The cervix was again grasped with a tenaculum and the cervical internal os was gently dilated with Kennon Rounds dilators.  The hysteroscope with the HTA was inserted.  The fluid fill was 0 mL.  HTA was performed for 10 minutes according to the normal manufacturer's specifications.  The intact hysteroscope was removed.  Sponge and instrument were counted and the counts were correct x2.  No bleeding was noted.  The patient went to recovery room after extubation in stable condition.     Michelle L. Helane Rima, M.D.     Nevin Bloodgood  D:  09/07/2015  T:  09/08/2015  Job:  [id number]

## 2015-09-10 ENCOUNTER — Encounter (HOSPITAL_COMMUNITY): Payer: Self-pay | Admitting: Obstetrics and Gynecology

## 2016-02-15 NOTE — H&P (Signed)
  40 year old G 2 P 2 with dense pelvic adhesions and pelvic pain. At the time of her tubal ligation and HTA, dense adhesions involving the uterus to the anterior abdominal wall were noted.  She also has a uterine fibroid.    Past Medical History  Diagnosis Date  . Anxiety   . B12 deficiency    Past Surgical History  Procedure Laterality Date  . Incise and drain abcess      back  . Cesarean section      x 2  . Iud removal N/A 09/07/2015    Procedure: INTRAUTERINE DEVICE (IUD) REMOVAL;  Surgeon: Dian Queen, MD;  Location: [facility];  Service: Gynecology;  Laterality: N/A;  . Dilitation & currettage/hystroscopy with hydrothermal ablation N/A 09/07/2015    Procedure: DILATATION & CURETTAGE/HYSTEROSCOPY WITH HYDROTHERMAL ABLATION;  Surgeon: Dian Queen, MD;  Location: [facility];  Service: Gynecology;  Laterality: N/A;  . Laparoscopic tubal ligation Bilateral 09/07/2015    Procedure: LAPAROSCOPIC TUBAL LIGATION;  Surgeon: Dian Queen, MD;  Location: [facility];  Service: Gynecology;  Laterality: Bilateral;   Prior to Admission medications   Medication Sig Start Date End Date Taking? Authorizing Provider  alprazolam Duanne Moron) 2 MG tablet Take 2 mg by mouth every 8 (eight) hours as needed. for anxiety 01/27/16  Yes Historical Provider, MD  oxyCODONE-acetaminophen (PERCOCET) 10-325 MG tablet Take 1 tablet by mouth every 4 (four) hours as needed for pain. 09/07/15  Yes Dian Queen, MD  zolpidem (AMBIEN) 10 MG tablet Take 1 tablet (10 mg total) by mouth at bedtime. 06/08/15  Yes Aleksei Plotnikov V, MD  Cyanocobalamin (VITAMIN B-12) 500 MCG SUBL 1 sl qd Patient taking differently: Place 1 tablet under the tongue daily.  06/12/15   Aleksei Plotnikov V, MD  ibuprofen (ADVIL,MOTRIN) 600 MG tablet Take 1 tablet (600 mg total) by mouth every 6 (six) hours as needed. Patient not taking: Reported on 02/12/2016 09/07/15   Dian Queen, MD   Family History: Unremarkable  Social History  : Unremarkable Denies tobacco  Afebrile Vital signs stable General alert and oriented Lung CTAB Car RRR Abdomen is soft and tender in lower abdomen  IMPRESSION: Pelvic Pain Pelvic Adhesions Uterine Fibroid  PLAN: TAH  BSO Consent signed after risks reviewed with patient

## 2016-02-19 NOTE — Patient Instructions (Signed)
Your procedure is scheduled on:  Monday, March 03, 2016  Enter through the Main Entrance of [facility] at:  6:00 AM  Pick up the phone at the desk and dial [phone].  Call this number if you have problems the morning of surgery: [phone].  Remember:  Do NOT eat food or drink after:  Midnight Sunday,  March 02, 2016  Take these medicines the morning of surgery with a SIP OF WATER:  Xanax if needed  Do NOT wear jewelry (body piercing), metal hair clips/bobby pins, make-up, or nail polish. Do NOT wear lotions, powders, or perfumes.  You may wear deoderant. Do NOT shave for 48 hours prior to surgery. Do NOT bring valuables to the hospital. Contacts, dentures, or bridgework may not be worn into surgery.  Leave suitcase in car.  After surgery it may be brought to your room.  For patients admitted to the hospital, checkout time is 11:00 AM the day of discharge.

## 2016-02-20 ENCOUNTER — Encounter (HOSPITAL_COMMUNITY)
Admission: RE | Admit: 2016-02-20 | Discharge: 2016-02-20 | Disposition: A | Discharge status: Home / Self Care | Payer: BC Managed Care – PPO | Source: Ambulatory Visit | Attending: Obstetrics and Gynecology | Admitting: Obstetrics and Gynecology

## 2016-02-20 ENCOUNTER — Encounter (HOSPITAL_COMMUNITY): Payer: Self-pay

## 2016-02-20 DIAGNOSIS — Z01812 Encounter for preprocedural laboratory examination: Secondary | ICD-10-CM | POA: Diagnosis present

## 2016-02-20 LAB — CBC
HCT: 43.2 % (ref 36.0–46.0)
HEMOGLOBIN: 14.5 g/dL (ref 12.0–15.0)
MCH: 31.3 pg (ref 26.0–34.0)
MCHC: 33.6 g/dL (ref 30.0–36.0)
MCV: 93.3 fL (ref 78.0–100.0)
Platelets: 330 10*3/uL (ref 150–400)
RBC: 4.63 MIL/uL (ref 3.87–5.11)
RDW: 13.1 % (ref 11.5–15.5)
WBC: 9.5 10*3/uL (ref 4.0–10.5)

## 2016-02-20 LAB — TYPE AND SCREEN
ABO/RH(D): A POS
Antibody Screen: NEGATIVE

## 2016-03-03 ENCOUNTER — Encounter (HOSPITAL_COMMUNITY)
Admission: RE | Disposition: A | Discharge status: Home / Self Care | Payer: Self-pay | Source: Ambulatory Visit | Attending: Obstetrics and Gynecology

## 2016-03-03 ENCOUNTER — Inpatient Hospital Stay (HOSPITAL_COMMUNITY)
Admission: RE | Admit: 2016-03-03 | Discharge: 2016-03-05 | DRG: 743 | Disposition: A | Discharge status: Home / Self Care | Payer: BC Managed Care – PPO | Source: Ambulatory Visit | Attending: Obstetrics and Gynecology | Admitting: Obstetrics and Gynecology

## 2016-03-03 ENCOUNTER — Inpatient Hospital Stay (HOSPITAL_COMMUNITY): Payer: BC Managed Care – PPO | Admitting: Anesthesiology

## 2016-03-03 ENCOUNTER — Encounter (HOSPITAL_COMMUNITY): Payer: Self-pay | Admitting: *Deleted

## 2016-03-03 DIAGNOSIS — N736 Female pelvic peritoneal adhesions (postinfective): Secondary | ICD-10-CM | POA: Diagnosis present

## 2016-03-03 DIAGNOSIS — D259 Leiomyoma of uterus, unspecified: Principal | ICD-10-CM | POA: Diagnosis present

## 2016-03-03 DIAGNOSIS — Z90710 Acquired absence of both cervix and uterus: Secondary | ICD-10-CM

## 2016-03-03 HISTORY — PX: ABDOMINAL HYSTERECTOMY: SHX81

## 2016-03-03 HISTORY — DX: Acquired absence of both cervix and uterus: Z90.710

## 2016-03-03 SURGERY — HYSTERECTOMY, ABDOMINAL
Anesthesia: General | Laterality: Bilateral

## 2016-03-03 MED ORDER — NALOXONE HCL 0.4 MG/ML IJ SOLN: 0.4000 mg | INTRAMUSCULAR | Status: DC | PRN

## 2016-03-03 MED ORDER — ONDANSETRON HCL 4 MG/2ML IJ SOLN: 4.0000 mg | Freq: Four times a day (QID) | INTRAMUSCULAR | Status: DC | PRN

## 2016-03-03 MED ORDER — ROCURONIUM BROMIDE 100 MG/10ML IV SOLN
INTRAVENOUS | Status: AC
  Filled 2016-03-03: qty 1

## 2016-03-03 MED ORDER — DIPHENHYDRAMINE HCL 12.5 MG/5ML PO ELIX: 12.5000 mg | ORAL_SOLUTION | Freq: Four times a day (QID) | ORAL | Status: DC | PRN

## 2016-03-03 MED ORDER — LIDOCAINE HCL (CARDIAC) 20 MG/ML IV SOLN
INTRAVENOUS | Status: DC | PRN
Start: 2016-03-03 — End: 2016-03-03
  Administered 2016-03-03: 100 mg via INTRAVENOUS

## 2016-03-03 MED ORDER — HYDROMORPHONE HCL 1 MG/ML IJ SOLN
0.2500 mg | INTRAMUSCULAR | Status: DC | PRN
  Administered 2016-03-03 (×2): 0.5 mg via INTRAVENOUS

## 2016-03-03 MED ORDER — TRAMADOL HCL 50 MG PO TABS
50.0000 mg | ORAL_TABLET | Freq: Four times a day (QID) | ORAL | Status: DC | PRN
  Administered 2016-03-04: 50 mg via ORAL
  Filled 2016-03-03: qty 1

## 2016-03-03 MED ORDER — HYDROMORPHONE HCL 1 MG/ML IJ SOLN
INTRAMUSCULAR | Status: AC
  Filled 2016-03-03: qty 1

## 2016-03-03 MED ORDER — SCOPOLAMINE 1 MG/3DAYS TD PT72
MEDICATED_PATCH | TRANSDERMAL | Status: AC
  Administered 2016-03-03: 1.5 mg via TRANSDERMAL
  Filled 2016-03-03: qty 1

## 2016-03-03 MED ORDER — IBUPROFEN 600 MG PO TABS
600.0000 mg | ORAL_TABLET | Freq: Four times a day (QID) | ORAL | Status: DC | PRN
Start: 2016-03-03 — End: 2016-03-05

## 2016-03-03 MED ORDER — FENTANYL CITRATE (PF) 100 MCG/2ML IJ SOLN
INTRAMUSCULAR | Status: DC | PRN
  Administered 2016-03-03 (×2): 100 ug via INTRAVENOUS

## 2016-03-03 MED ORDER — PROPOFOL 10 MG/ML IV BOLUS
INTRAVENOUS | Status: AC
  Filled 2016-03-03: qty 20

## 2016-03-03 MED ORDER — LIDOCAINE HCL (CARDIAC) 20 MG/ML IV SOLN
INTRAVENOUS | Status: AC
Start: 2016-03-03 — End: 2016-03-03
  Filled 2016-03-03: qty 5

## 2016-03-03 MED ORDER — GLYCOPYRROLATE 0.2 MG/ML IJ SOLN
INTRAMUSCULAR | Status: AC
  Filled 2016-03-03: qty 3

## 2016-03-03 MED ORDER — HYDROMORPHONE HCL 1 MG/ML IJ SOLN
INTRAMUSCULAR | Status: DC | PRN
  Administered 2016-03-03: 1 mg via INTRAVENOUS

## 2016-03-03 MED ORDER — FENTANYL CITRATE (PF) 100 MCG/2ML IJ SOLN
INTRAMUSCULAR | Status: AC
  Filled 2016-03-03: qty 2

## 2016-03-03 MED ORDER — HYDROMORPHONE HCL 1 MG/ML IJ SOLN
INTRAMUSCULAR | Status: AC
  Administered 2016-03-03: 0.5 mg via INTRAVENOUS
  Filled 2016-03-03: qty 1

## 2016-03-03 MED ORDER — HYDROMORPHONE 1 MG/ML IV SOLN
INTRAVENOUS | Status: DC
  Administered 2016-03-03: 2.4 mg via INTRAVENOUS
  Administered 2016-03-03: 11:00:00 via INTRAVENOUS
  Administered 2016-03-03: 4.4 mg via INTRAVENOUS
  Administered 2016-03-03: 2.4 mg via INTRAVENOUS
  Administered 2016-03-04: 2.6 mg via INTRAVENOUS
  Administered 2016-03-04: 2 mg via INTRAVENOUS
  Filled 2016-03-03: qty 25

## 2016-03-03 MED ORDER — ONDANSETRON HCL 4 MG/2ML IJ SOLN
INTRAMUSCULAR | Status: AC
  Filled 2016-03-03: qty 2

## 2016-03-03 MED ORDER — ROCURONIUM BROMIDE 100 MG/10ML IV SOLN
INTRAVENOUS | Status: DC | PRN
  Administered 2016-03-03: 10 mg via INTRAVENOUS
  Administered 2016-03-03: 40 mg via INTRAVENOUS

## 2016-03-03 MED ORDER — DIPHENHYDRAMINE HCL 50 MG/ML IJ SOLN: 12.5000 mg | Freq: Four times a day (QID) | INTRAMUSCULAR | Status: DC | PRN

## 2016-03-03 MED ORDER — LACTATED RINGERS IV SOLN
INTRAVENOUS | Status: DC
  Administered 2016-03-03 – 2016-03-04 (×2): via INTRAVENOUS

## 2016-03-03 MED ORDER — LACTATED RINGERS IV SOLN
INTRAVENOUS | Status: DC
  Administered 2016-03-03 (×4): via INTRAVENOUS

## 2016-03-03 MED ORDER — MIDAZOLAM HCL 2 MG/2ML IJ SOLN
INTRAMUSCULAR | Status: DC | PRN
  Administered 2016-03-03: 2 mg via INTRAVENOUS

## 2016-03-03 MED ORDER — SODIUM CHLORIDE 0.9% FLUSH: 9.0000 mL | INTRAVENOUS | Status: DC | PRN

## 2016-03-03 MED ORDER — CEFAZOLIN SODIUM-DEXTROSE 2-3 GM-% IV SOLR
INTRAVENOUS | Status: AC
  Filled 2016-03-03: qty 50

## 2016-03-03 MED ORDER — SODIUM CHLORIDE 0.9 % IJ SOLN
INTRAMUSCULAR | Status: AC
  Filled 2016-03-03: qty 50

## 2016-03-03 MED ORDER — DEXAMETHASONE SODIUM PHOSPHATE 4 MG/ML IJ SOLN
INTRAMUSCULAR | Status: AC
  Filled 2016-03-03: qty 1

## 2016-03-03 MED ORDER — SCOPOLAMINE 1 MG/3DAYS TD PT72
1.0000 | MEDICATED_PATCH | Freq: Once | TRANSDERMAL | Status: DC
  Administered 2016-03-03: 1.5 mg via TRANSDERMAL

## 2016-03-03 MED ORDER — NEOSTIGMINE METHYLSULFATE 10 MG/10ML IV SOLN
INTRAVENOUS | Status: DC | PRN
  Administered 2016-03-03: 3 mg via INTRAVENOUS
  Administered 2016-03-03: 2 mg via INTRAVENOUS

## 2016-03-03 MED ORDER — FENTANYL CITRATE (PF) 250 MCG/5ML IJ SOLN
INTRAMUSCULAR | Status: AC
  Filled 2016-03-03: qty 5

## 2016-03-03 MED ORDER — CEFAZOLIN SODIUM-DEXTROSE 2-4 GM/100ML-% IV SOLN
2.0000 g | INTRAVENOUS | Status: AC
  Administered 2016-03-03: 2 g via INTRAVENOUS
  Filled 2016-03-03: qty 100

## 2016-03-03 MED ORDER — NEOSTIGMINE METHYLSULFATE 10 MG/10ML IV SOLN
INTRAVENOUS | Status: AC
  Filled 2016-03-03: qty 1

## 2016-03-03 MED ORDER — ONDANSETRON HCL 4 MG/2ML IJ SOLN
INTRAMUSCULAR | Status: DC | PRN
  Administered 2016-03-03: 4 mg via INTRAVENOUS

## 2016-03-03 MED ORDER — LACTATED RINGERS IV SOLN: INTRAVENOUS | Status: DC

## 2016-03-03 MED ORDER — GLYCOPYRROLATE 0.2 MG/ML IJ SOLN
INTRAMUSCULAR | Status: DC | PRN
  Administered 2016-03-03: .4 mg via INTRAVENOUS
  Administered 2016-03-03: .2 mg via INTRAVENOUS

## 2016-03-03 MED ORDER — PROPOFOL 10 MG/ML IV BOLUS
INTRAVENOUS | Status: DC | PRN
  Administered 2016-03-03: 150 mg via INTRAVENOUS

## 2016-03-03 MED ORDER — MENTHOL 3 MG MT LOZG: 1.0000 | LOZENGE | OROMUCOSAL | Status: DC | PRN

## 2016-03-03 MED ORDER — MIDAZOLAM HCL 2 MG/2ML IJ SOLN
INTRAMUSCULAR | Status: AC
  Filled 2016-03-03: qty 2

## 2016-03-03 MED ORDER — BUPIVACAINE HCL (PF) 0.25 % IJ SOLN
INTRAMUSCULAR | Status: AC
  Filled 2016-03-03: qty 30

## 2016-03-03 MED ORDER — INFLUENZA VAC SPLIT QUAD 0.5 ML IM SUSY
0.5000 mL | PREFILLED_SYRINGE | INTRAMUSCULAR | Status: AC
  Administered 2016-03-05: 0.5 mL via INTRAMUSCULAR
  Filled 2016-03-03: qty 0.5

## 2016-03-03 SURGICAL SUPPLY — 40 items
BRR ADH 6X5 SEPRAFILM 1 SHT (MISCELLANEOUS)
CANISTER SUCT 3000ML (MISCELLANEOUS) ×2 IMPLANT
CLOTH BEACON ORANGE TIMEOUT ST (SAFETY) ×2 IMPLANT
CONT PATH 16OZ SNAP LID 3702 (MISCELLANEOUS) ×1 IMPLANT
DECANTER SPIKE VIAL GLASS SM (MISCELLANEOUS) ×1 IMPLANT
DRAPE CESAREAN BIRTH W POUCH (DRAPES) ×2 IMPLANT
DRAPE WARM FLUID 44X44 (DRAPE) ×1 IMPLANT
DRSG OPSITE POSTOP 4X10 (GAUZE/BANDAGES/DRESSINGS) ×2 IMPLANT
DURAPREP 26ML APPLICATOR (WOUND CARE) ×2 IMPLANT
GAUZE SPONGE 4X4 16PLY XRAY LF (GAUZE/BANDAGES/DRESSINGS) ×1 IMPLANT
GLOVE BIO SURGEON STRL SZ 6.5 (GLOVE) ×2 IMPLANT
GLOVE BIOGEL PI IND STRL 7.0 (GLOVE) ×4 IMPLANT
GLOVE BIOGEL PI INDICATOR 7.0 (GLOVE) ×4
GOWN STRL REUS W/TWL LRG LVL3 (GOWN DISPOSABLE) ×6 IMPLANT
HEMOSTAT ARISTA ABSORB 3G PWDR (MISCELLANEOUS) ×1 IMPLANT
LIQUID BAND (GAUZE/BANDAGES/DRESSINGS) ×2 IMPLANT
NEEDLE HYPO 22GX1.5 SAFETY (NEEDLE) ×2 IMPLANT
NS IRRIG 1000ML POUR BTL (IV SOLUTION) ×3 IMPLANT
PACK ABDOMINAL GYN (CUSTOM PROCEDURE TRAY) ×2 IMPLANT
PAD OB MATERNITY 4.3X12.25 (PERSONAL CARE ITEMS) ×2 IMPLANT
PENCIL SMOKE EVAC W/HOLSTER (ELECTROSURGICAL) ×2 IMPLANT
SEPRAFILM MEMBRANE 5X6 (MISCELLANEOUS) IMPLANT
SPONGE LAP 18X18 X RAY DECT (DISPOSABLE) ×2 IMPLANT
STAPLER VISISTAT 35W (STAPLE) IMPLANT
SUT PDS AB 0 CT 36 (SUTURE) IMPLANT
SUT PDS AB 0 CTX 60 (SUTURE) IMPLANT
SUT PLAIN 2 0 XLH (SUTURE) IMPLANT
SUT VIC AB 0 CT1 18XCR BRD8 (SUTURE) ×2 IMPLANT
SUT VIC AB 0 CT1 27 (SUTURE) ×8
SUT VIC AB 0 CT1 27XBRD ANBCTR (SUTURE) ×4 IMPLANT
SUT VIC AB 0 CT1 8-18 (SUTURE) ×6
SUT VIC AB 3-0 PS1 18 (SUTURE)
SUT VIC AB 3-0 PS1 18X BRD (SUTURE) IMPLANT
SUT VIC AB 4-0 KS 27 (SUTURE) ×2 IMPLANT
SUT VICRYL 0 TIES 12 18 (SUTURE) ×2 IMPLANT
SYR CONTROL 10ML LL (SYRINGE) ×1 IMPLANT
SYRINGE 10CC LL (SYRINGE) ×2 IMPLANT
TOWEL OR 17X24 6PK STRL BLUE (TOWEL DISPOSABLE) ×4 IMPLANT
TRAY FOLEY CATH SILVER 14FR (SET/KITS/TRAYS/PACK) ×2 IMPLANT
WATER STERILE IRR 1000ML POUR (IV SOLUTION) ×1 IMPLANT

## 2016-03-03 NOTE — Op Note (Signed)
NAMETAMULA, CHIAPPONE                ACCOUNT NO.:  [id number]  MEDICAL RECORD NO.:  [id number]  LOCATION:  9303                          FACILITY:  [facility]  PHYSICIAN:  Uriel Dowding L. Lexxie Winberg, M.D.DATE OF BIRTH:  [birth year]  DATE OF PROCEDURE:  03/03/2016 DATE OF DISCHARGE:                              OPERATIVE REPORT   PREOPERATIVE DIAGNOSIS:  Pelvic pain and fibroids and adhesions.  POSTOPERATIVE DIAGNOSIS:  Pelvic pain and fibroids and adhesions.  PROCEDURE:  Total abdominal hysterectomy and bilateral salpingectomy.  SURGEON:  Joei Frangos L. Helane Rima, MD  ASSISTANT:  Darlyn Chamber, MD  ANESTHESIA:  General.  EBL:  200 mL.  COMPLICATIONS:  None.  DRAINS:  Foley catheter.  PROCEDURE IN DETAIL:  Patient was taken to the operating room.  She was intubated.  She was prepped and draped.  A Foley catheter was inserted. A low transverse incision was made, carried down to the fascia.  She was noted to have significant adhesions in the subcu area.  The fascia was scored in the midline and extended laterally.  The rectus muscles were separated in the midline.  The peritoneum was entered bluntly.  The peritoneal incision was then stretched.  Upon entry into the abdominal cavity, was felt as uterus is normal size.  There was a very thick band of adhesions to the anterior abdominal wall from the uterus.  We then inserted a self-retaining retractor, placed the large and small bowel carefully in the upper abdomen, identified the ovaries that appeared normal.  We then grasped the triple pedicles on either side using curved Heaney clamps.  The round ligament on the right was suture ligated and that it was transected using the Bovie.  We then found an avascular window beneath the triple pedicle on the right and placed a curved Heaney clamp across the triple pedicle.  The pedicle was clamped, cut, and suture ligated using 0 Vicryl suture and further secured using a free tie of 0 Vicryl suture.  This  was done in identical fashion on the left side of the pelvis.  We then developed our bladder flap anteriorly and we took down the scar tissue anteriorly as well with the Bovie. There were dense adhesions involving the bladder flap to the anterior portion of the cervix.  So we took those down carefully with Metzenbaum scissors to make sure we were well away from the bladder.  After the bladder was dissected well away from our surgical field, we then placed a curved Heaney clamp at the uterine artery at the level of the internal os.  Each pedicle was clamped, cut, and suture ligated using 0 Vicryl suture.  We then walked our way down by the cervix staying snug beside the cervix by placing straight clamps and an each pedicle was clamped, cut, and suture ligated using 0 Vicryl suture.  The cervix was very short, so we then removed the cervix as well by placing curved Heaney clamps just beneath the external os.  The specimen was removed and identified the cervix, uterus, and fallopian tubes.  The specimen was then sent to Pathology.  We then closed the cuff in the standard fashion using angle stitches using 0  Vicryl and then closing with figure-of- eight using 0 Vicryl.  We then went back up to the ovaries.  The ovaries appeared to be normal.  There was still little bit of a stump of the fallopian tube on the left which I removed with curved Heaney clamp and a free tie.  Irrigation was performed.  Hemostasis was noted.  There was a little bit of raw oozing on the left side of the pelvis.  There was no definite bleeder, but I did place Arista in the pelvis to help with overall hemostasis.  I think this may have had something to do with the patient's dense adhesions.  Hemostasis was again noted.  All laparotomy pads and instruments were removed from the abdominal cavity.  The peritoneum was closed using 0 Vicryl.  The fascia was closed using 0 Vicryl starting each corner meeting in the midline.   After irrigation of subcutaneous layer and releasing significant scar tissue with the Bovie, we then reapproximated the skin using 4-0 Vicryl on a Keith needle.  A honeycomb dressing was applied.  All sponge, lap, and instrument counts were correct x2.  The patient went to recovery room in stable condition.     Glendel Jaggers L. Helane Rima, M.D.     Nevin Bloodgood  D:  03/03/2016  T:  03/03/2016  Job:  [id number]

## 2016-03-03 NOTE — Anesthesia Procedure Notes (Signed)
Procedure Name: Intubation Date/Time: 03/03/2016 7:35 AM Performed by: Caylor Tallarico, Sheron Nightingale Pre-anesthesia Checklist: Patient identified, Patient being monitored, Emergency Drugs available, Timeout performed and Suction available Patient Re-evaluated:Patient Re-evaluated prior to inductionOxygen Delivery Method: Circle system utilized Preoxygenation: Pre-oxygenation with 100% oxygen Intubation Type: IV induction Ventilation: Mask ventilation without difficulty Laryngoscope Size: Mac and 3 Grade View: Grade II Tube type: Oral Tube size: 7.0 mm Number of attempts: 1 Placement Confirmation: ETT inserted through vocal cords under direct vision Secured at: 22 cm Dental Injury: Teeth and Oropharynx as per pre-operative assessment

## 2016-03-03 NOTE — Addendum Note (Signed)
Addendum  created 03/03/16 1634 by Brock Ra, CRNA   Modules edited: Clinical Notes   Clinical Notes:  File: [id number]

## 2016-03-03 NOTE — Progress Notes (Signed)
H and P on the chart. No significant changes Will proceed with TAH and Bilateral salpingectomy Consent signed 

## 2016-03-03 NOTE — Transfer of Care (Signed)
Immediate Anesthesia Transfer of Care Note  Patient: Meghan [name]  Procedure(s) Performed: Procedure(s): HYSTERECTOMY ABDOMINAL, left salpingectomy (Bilateral)  Patient Location: PACU  Anesthesia Type:General  Level of Consciousness: awake, alert  and oriented  Airway & Oxygen Therapy: Patient Spontanous Breathing and Patient connected to nasal cannula oxygen  Post-op Assessment: Report given to RN, Post -op Vital signs reviewed and stable and Patient moving all extremities X 4  Post vital signs: Reviewed and stable  Last Vitals:  Filed Vitals:   03/03/16 0629  BP: 111/64  Pulse: 91  Temp: 37.1 C  Resp: 20    Complications: No apparent anesthesia complications

## 2016-03-03 NOTE — Anesthesia Postprocedure Evaluation (Signed)
Anesthesia Post Note  Patient: Meghan [name]  Procedure(s) Performed: Procedure(s) (LRB): HYSTERECTOMY ABDOMINAL, left salpingectomy (Bilateral)  Patient location during evaluation: [facility] Anesthesia Type: General Level of consciousness: awake and alert and oriented Pain management: pain level controlled Vital Signs Assessment: post-procedure vital signs reviewed and stable Respiratory status: spontaneous breathing and nonlabored ventilation Cardiovascular status: blood pressure returned to baseline Postop Assessment: no headache, no backache, patient able to bend at knees, no signs of nausea or vomiting and adequate PO intake Anesthetic complications: no    Last Vitals:  Filed Vitals:   03/03/16 1345 03/03/16 1445  BP: 116/67 113/72  Pulse: 102 93  Temp: 37.3 C 36.8 C  Resp: 12 10    Last Pain:  Filed Vitals:   03/03/16 1629  PainSc: 3                  Lonza Shimabukuro

## 2016-03-03 NOTE — Anesthesia Postprocedure Evaluation (Signed)
Anesthesia Post Note  Patient: Meghan [name]  Procedure(s) Performed: Procedure(s) (LRB): HYSTERECTOMY ABDOMINAL, left salpingectomy (Bilateral)  Patient location during evaluation: PACU Anesthesia Type: General Level of consciousness: awake Pain management: pain level controlled Vital Signs Assessment: post-procedure vital signs reviewed and stable Respiratory status: spontaneous breathing Cardiovascular status: stable Anesthetic complications: no    Last Vitals:  Filed Vitals:   03/03/16 1015 03/03/16 1024  BP: 121/73   Pulse: 84   Temp:  36.8 C  Resp: 19     Last Pain:  Filed Vitals:   03/03/16 1028  PainSc: 3                  EDWARDS,Ameisha Mcclellan

## 2016-03-03 NOTE — Brief Op Note (Signed)
03/03/2016  8:46 AM  PATIENT:  Meghan [name]  40 y.o. female  PRE-OPERATIVE DIAGNOSIS:  Fibroids Pelvic Pain Adhesions  POST-OPERATIVE DIAGNOSIS: Same  PROCEDURE:  Procedure(s): HYSTERECTOMY ABDOMINAL, left salpingectomy (Bilateral)  SURGEON:  Surgeon(s) and Role:    * Dian Queen, MD - Primary  PHYSICIAN ASSISTANT:   ASSISTANTS: none   ANESTHESIA:   general  EBL:  Total I/O In: 1000 [I.V.:1000] Out: 300 [Urine:100; Blood:200]  BLOOD ADMINISTERED:none  DRAINS: Urinary Catheter (Foley)   LOCAL MEDICATIONS USED:  NONE  SPECIMEN:  Source of Specimen:  cervix uterus fallopian tubes  DISPOSITION OF SPECIMEN:  PATHOLOGY  COUNTS:  YES  TOURNIQUET:  * No tourniquets in log *  DICTATIONKN:[id number]  PLAN OF CARE: Admit to inpatient   PATIENT DISPOSITION:  PACU - hemodynamically stable.   Delay start of Pharmacological VTE agent (>24hrs) due to surgical blood loss or risk of bleeding: not applicable

## 2016-03-03 NOTE — Addendum Note (Signed)
Addendum  created 03/03/16 1146 by Lyda Jester, CRNA   Modules edited: Anesthesia Events, Narrator   Narrator:  Narrator: Event Log Edited

## 2016-03-03 NOTE — Anesthesia Preprocedure Evaluation (Addendum)
Anesthesia Evaluation  Patient identified by MRN, date of birth, ID band Patient awake  General Assessment Comment:History noted. CE  Reviewed: Allergy & Precautions, NPO status , Patient's Chart, lab work & pertinent test results  Airway Mallampati: II  TM Distance: >3 FB Neck ROM: Full    Dental   Pulmonary neg pulmonary ROS,    breath sounds clear to auscultation       Cardiovascular negative cardio ROS   Rhythm:Regular Rate:Normal     Neuro/Psych    GI/Hepatic negative GI ROS, Neg liver ROS,   Endo/Other  negative endocrine ROS  Renal/GU negative Renal ROS     Musculoskeletal   Abdominal   Peds  Hematology   Anesthesia Other Findings   Reproductive/Obstetrics                            Anesthesia Physical Anesthesia Plan  ASA: II  Anesthesia Plan: General   Post-op Pain Management:    Induction: Intravenous  Airway Management Planned: Oral ETT  Additional Equipment:   Intra-op Plan:   Post-operative Plan: Extubation in OR  Informed Consent: I have reviewed the patients History and Physical, chart, labs and discussed the procedure including the risks, benefits and alternatives for the proposed anesthesia with the patient or authorized representative who has indicated his/her understanding and acceptance.   Dental advisory given  Plan Discussed with: CRNA and Anesthesiologist  Anesthesia Plan Comments:         Anesthesia Quick Evaluation

## 2016-03-04 ENCOUNTER — Encounter (HOSPITAL_COMMUNITY): Payer: Self-pay | Admitting: Obstetrics and Gynecology

## 2016-03-04 LAB — BASIC METABOLIC PANEL
ANION GAP: 8 (ref 5–15)
BUN: 7 mg/dL (ref 6–20)
CHLORIDE: 100 mmol/L — AB (ref 101–111)
CO2: 29 mmol/L (ref 22–32)
Calcium: 8.6 mg/dL — ABNORMAL LOW (ref 8.9–10.3)
Creatinine, Ser: 0.65 mg/dL (ref 0.44–1.00)
GFR calc non Af Amer: >60 mL/min (ref >60)
Glucose, Bld: 90 mg/dL (ref 65–99)
POTASSIUM: 4.1 mmol/L (ref 3.5–5.1)
SODIUM: 137 mmol/L (ref 135–145)

## 2016-03-04 LAB — CBC
HEMATOCRIT: 35.7 % — AB (ref 36.0–46.0)
HEMOGLOBIN: 11.9 g/dL — AB (ref 12.0–15.0)
MCH: 31 pg (ref 26.0–34.0)
MCHC: 33.3 g/dL (ref 30.0–36.0)
MCV: 93 fL (ref 78.0–100.0)
PLATELETS: 289 10*3/uL (ref 150–400)
RBC: 3.84 MIL/uL — AB (ref 3.87–5.11)
RDW: 12.6 % (ref 11.5–15.5)
WBC: 14.5 10*3/uL — AB (ref 4.0–10.5)

## 2016-03-04 MED ORDER — OXYCODONE-ACETAMINOPHEN 7.5-325 MG PO TABS
2.0000 | ORAL_TABLET | ORAL | Status: DC | PRN
  Administered 2016-03-04 – 2016-03-05 (×5): 2 via ORAL
  Filled 2016-03-04 (×5): qty 2

## 2016-03-04 NOTE — Progress Notes (Signed)
UR chart review completed.  

## 2016-03-04 NOTE — Progress Notes (Signed)
Patient is doing well.  Some incisional pain.  BP 105/60 mmHg  Pulse 71  Temp(Src) 97.6 F (36.4 C) (Oral)  Resp 14  Ht 5\' 3"  (1.6 m)  Wt 89.812 kg (198 lb)  BMI 35.08 kg/m2  SpO2 98% Results for orders placed or performed during the hospital encounter of 03/03/16 (from the past 24 hour(s))  Basic metabolic panel     Status: Abnormal   Collection Time: 03/04/16  5:04 AM  Result Value Ref Range   Sodium 137 135 - 145 mmol/L   Potassium 4.1 3.5 - 5.1 mmol/L   Chloride 100 (L) 101 - 111 mmol/L   CO2 29 22 - 32 mmol/L   Glucose, Bld 90 65 - 99 mg/dL   BUN 7 6 - 20 mg/dL   Creatinine, Ser 0.65 0.44 - 1.00 mg/dL   Calcium 8.6 (L) 8.9 - 10.3 mg/dL   GFR calc non Af Amer >60 >60 mL/min   GFR calc Af Amer >60 >60 mL/min   Anion gap 8 5 - 15  CBC     Status: Abnormal   Collection Time: 03/04/16  5:04 AM  Result Value Ref Range   WBC 14.5 (H) 4.0 - 10.5 K/uL   RBC 3.84 (L) 3.87 - 5.11 MIL/uL   Hemoglobin 11.9 (L) 12.0 - 15.0 g/dL   HCT 35.7 (L) 36.0 - 46.0 %   MCV 93.0 78.0 - 100.0 fL   MCH 31.0 26.0 - 34.0 pg   MCHC 33.3 30.0 - 36.0 g/dL   RDW 12.6 11.5 - 15.5 %   Platelets 289 150 - 400 K/uL   Abdomen is soft and non tender Incision is clean and dry and intact  IMPRESSION: POD # 1 Doing well.  Routine care  Percocet prn  Ambulate Remove foley Advance diet Possible discharge later today or in am

## 2016-03-05 MED ORDER — BISACODYL 10 MG RE SUPP
10.0000 mg | Freq: Once | RECTAL | Status: AC
  Administered 2016-03-05: 10 mg via RECTAL
  Filled 2016-03-05: qty 1

## 2016-03-05 MED ORDER — IBUPROFEN 600 MG PO TABS: 600.0000 mg | ORAL_TABLET | Freq: Four times a day (QID) | ORAL | Status: DC | PRN

## 2016-03-05 MED ORDER — OXYCODONE-ACETAMINOPHEN 7.5-325 MG PO TABS: 2.0000 | ORAL_TABLET | ORAL | Status: DC | PRN

## 2016-03-05 NOTE — Progress Notes (Signed)
S:  Patient doing ok except has not passed gas yet.  O:  BP 88/51 mmHg  Pulse 78  Temp(Src) 98.6 F (37 C) (Oral)  Resp 14  Ht 5\' 3"  (1.6 m)  Wt 89.812 kg (198 lb)  BMI 35.08 kg/m2  SpO2 99% No results found for this or any previous visit (from the past 24 hour(s)). Abdomen is soft and slightly distended, minimally tender  Incision clean dry and intact  IMPRESSION: POD #2 doing well Dulcolax suppository Possible discharge later today

## 2016-03-05 NOTE — Progress Notes (Signed)
Patient discharged home with family... Discharge instructions reviewed with patient and she verbalized understanding... Condition stable... No equipment... Taken to car via wheelchair by Janalyn Shy, RN.

## 2016-03-05 NOTE — Discharge Summary (Signed)
  Admission Diagnosis: Pelvic Pain Fibroids  Discharge Diagnosis: Same  Hospital Course: 40 year old female underwent TAH and bilateral salpingectomy. Her surgery was uncomplicated. She had a normal post op course.  POD #1 she was ambulating, eating and voiding. On POD #2 she passed gas and was ready for discharge.  Discharged home in good condition.  Rx Ibuprofen and percocet  No driving x 1 week  Follow up in 1 week

## 2016-09-11 DIAGNOSIS — N3010 Interstitial cystitis (chronic) without hematuria: Secondary | ICD-10-CM

## 2016-09-11 HISTORY — DX: Interstitial cystitis (chronic) without hematuria: N30.10

## 2018-11-18 DIAGNOSIS — R3129 Other microscopic hematuria: Secondary | ICD-10-CM | POA: Insufficient documentation

## 2018-11-18 HISTORY — DX: Other microscopic hematuria: R31.29

## 2019-09-21 ENCOUNTER — Telehealth: Payer: Self-pay | Admitting: Diagnostic Neuroimaging

## 2019-09-21 ENCOUNTER — Ambulatory Visit (INDEPENDENT_AMBULATORY_CARE_PROVIDER_SITE_OTHER): Payer: 59 | Admitting: Diagnostic Neuroimaging

## 2019-09-21 ENCOUNTER — Encounter: Payer: Self-pay | Admitting: Diagnostic Neuroimaging

## 2019-09-21 ENCOUNTER — Other Ambulatory Visit: Payer: Self-pay

## 2019-09-21 VITALS — BP 99/69 | HR 77 | Temp 98.0°F | Ht 63.0 in | Wt 204.5 lb

## 2019-09-21 DIAGNOSIS — R252 Cramp and spasm: Secondary | ICD-10-CM | POA: Diagnosis not present

## 2019-09-21 DIAGNOSIS — R200 Anesthesia of skin: Secondary | ICD-10-CM

## 2019-09-21 NOTE — Progress Notes (Signed)
[facility]  PATIENT: Meghan [name] DOB: 1976-05-05  REFERRING CLINICIAN: Grewal HISTORY FROM: patient  REASON FOR VISIT: new consult    HISTORICAL  CHIEF COMPLAINT:  Chief Complaint  Patient presents with  . Anesthesia of skin    rm 7 New Pt "numbness/cramps in legs and arms since hysterectomy 2017, getting worse"    HISTORY OF PRESENT ILLNESS:   44 year old female here for evaluation of numbness and cramps.  Patient had hysterectomy 2017 and she developed postoperative numbness and cramps in her arms and legs.  Symptoms have continued since that time.  Symptoms are intermittent.  No problems with vision, face, speech or swallowing.  She has neck pain and low back pain.  Patient also has significant anxiety and insomnia, averaging 4 hours of sleep.  Insomnia related to pain and anxiety issues.  Patient also has seen orthopedic surgery for bilateral hand numbness and pain, had EMG testing which shows bilateral carpal tunnel syndrome.  Patient has tried gabapentin, Flexeril, trazodone, Ambien, Xanax without relief.   REVIEW OF SYSTEMS: Full 14 system review of systems performed and negative with exception of: As per HPI.  ALLERGIES: No Known Allergies  HOME MEDICATIONS: Outpatient Medications Prior to Visit  Medication Sig Dispense Refill  . alprazolam (XANAX) 2 MG tablet Take 2 mg by mouth every 8 (eight) hours as needed. for anxiety  1  . pentosan polysulfate (ELMIRON) 100 MG capsule Elmiron 100 mg capsule  Take 1 capsule twice a day by oral route.    . progesterone (PROMETRIUM) 100 MG capsule progesterone micronized 100 mg capsule  Take 1 capsule every day by oral route.    . traZODone (DESYREL) 50 MG tablet trazodone 50 mg tablet  TAKE 1 TABLET BY MOUTH EVERY DAY    . ibuprofen (ADVIL,MOTRIN) 600 MG tablet Take 1 tablet (600 mg total) by mouth every 6 (six) hours as needed (mild pain). 60 tablet 0  . oxyCODONE-acetaminophen (PERCOCET) 7.5-325 MG  tablet Take 2 tablets by mouth every 4 (four) hours as needed for severe pain. 60 tablet 0  . zolpidem (AMBIEN) 10 MG tablet Take 1 tablet (10 mg total) by mouth at bedtime. 30 tablet 2   No facility-administered medications prior to visit.     PAST MEDICAL HISTORY: Past Medical History:  Diagnosis Date  . Anxiety   . B12 deficiency     PAST SURGICAL HISTORY: Past Surgical History:  Procedure Laterality Date  . ABDOMINAL HYSTERECTOMY Bilateral 03/03/2016   Procedure: HYSTERECTOMY ABDOMINAL, left salpingectomy;  Surgeon: Dian Queen, MD;  Location: [facility];  Service: Gynecology;  Laterality: Bilateral;  . CESAREAN SECTION     x 2  . DIAGNOSTIC LAPAROSCOPY    . DILATION AND CURETTAGE OF UTERUS    . DILITATION & CURRETTAGE/HYSTROSCOPY WITH HYDROTHERMAL ABLATION N/A 09/07/2015   Procedure: DILATATION & CURETTAGE/HYSTEROSCOPY WITH HYDROTHERMAL ABLATION;  Surgeon: Dian Queen, MD;  Location: [facility];  Service: Gynecology;  Laterality: N/A;  . INCISE AND DRAIN ABCESS     back  . IUD REMOVAL N/A 09/07/2015   Procedure: INTRAUTERINE DEVICE (IUD) REMOVAL;  Surgeon: Dian Queen, MD;  Location: [facility];  Service: Gynecology;  Laterality: N/A;  . LAPAROSCOPIC TUBAL LIGATION Bilateral 09/07/2015   Procedure: LAPAROSCOPIC TUBAL LIGATION;  Surgeon: Dian Queen, MD;  Location: [facility];  Service: Gynecology;  Laterality: Bilateral;  . TUBAL LIGATION      FAMILY HISTORY: Family History  Problem Relation Age of Onset  . Hypertension Father   .  Diabetes Maternal Grandmother   . Heart attack Maternal Grandfather   . Diabetes Paternal Grandmother     SOCIAL HISTORY: Social History   Socioeconomic History  . Marital status: Single    Spouse name: Not on file  . Number of children: 2  . Years of education: Not on file  . Highest education level: Associate degree: academic program  Occupational History    Comment: 09/2019 [employer]  Social Needs  . Financial resource strain: Not on  file  . Food insecurity    Worry: Not on file    Inability: Not on file  . Transportation needs    Medical: Not on file    Non-medical: Not on file  Tobacco Use  . Smoking status: Never Smoker  . Smokeless tobacco: Never Used  Substance and Sexual Activity  . Alcohol use: No  . Drug use: No  . Sexual activity: Never  Lifestyle  . Physical activity    Days per week: Not on file    Minutes per session: Not on file  . Stress: Not on file  Relationships  . Social Herbalist on phone: Not on file    Gets together: Not on file    Attends religious service: Not on file    Active member of club or organization: Not on file    Attends meetings of clubs or organizations: Not on file    Relationship status: Not on file  . Intimate partner violence    Fear of current or ex partner: Not on file    Emotionally abused: Not on file    Physically abused: Not on file    Forced sexual activity: Not on file  Other Topics Concern  . Not on file  Social History Narrative   Lives with one child   Caffeine- none     PHYSICAL EXAM  GENERAL EXAM/CONSTITUTIONAL: Vitals:  Vitals:   09/21/19 0943  BP: 99/69  Pulse: 77  Temp: 98 F (36.7 C)  Weight: 204 lb 8 oz (92.8 kg)  Height: 5\' 3"  (1.6 m)     Body mass index is 36.23 kg/m. Wt Readings from Last 3 Encounters:  09/21/19 204 lb 8 oz (92.8 kg)  03/03/16 198 lb (89.8 kg)  02/20/16 201 lb 8 oz (91.4 kg)     Patient is in no distress; well developed, nourished and groomed; neck is supple  CARDIOVASCULAR:  Examination of carotid arteries is normal; no carotid bruits  Regular rate and rhythm, no murmurs  Examination of peripheral vascular system by observation and palpation is normal  EYES:  Ophthalmoscopic exam of optic discs and posterior segments is normal; no papilledema or hemorrhages  No exam data present  MUSCULOSKELETAL:  Gait, strength, tone, movements noted in Neurologic exam below  NEUROLOGIC:  MENTAL STATUS:  No flowsheet data found.  awake, alert, oriented to person, place and time  recent and remote memory intact  normal attention and concentration  language fluent, comprehension intact, naming intact  fund of knowledge appropriate  CRANIAL NERVE:   2nd - no papilledema on fundoscopic exam  2nd, 3rd, 4th, 6th - pupils equal and reactive to light, visual fields full to confrontation, extraocular muscles intact, no nystagmus  5th - facial sensation symmetric  7th - facial strength symmetric  8th - hearing intact  9th - palate elevates symmetrically, uvula midline  11th - shoulder shrug symmetric  12th - tongue protrusion midline  MOTOR:   normal bulk and tone, full strength  in the BUE, BLE  SENSORY:   normal and symmetric to light touch, temperature, vibration  COORDINATION:   finger-nose-finger, fine finger movements normal  REFLEXES:   deep tendon reflexes TRACE and symmetric  GAIT/STATION:   narrow based gait     DIAGNOSTIC DATA (LABS, IMAGING, TESTING) - I reviewed patient records, labs, notes, testing and imaging myself where available.  Lab Results  Component Value Date   WBC 14.5 (H) 03/04/2016   HGB 11.9 (L) 03/04/2016   HCT 35.7 (L) 03/04/2016   MCV 93.0 03/04/2016   PLT 289 03/04/2016      Component Value Date/Time   NA 137 03/04/2016 0504   K 4.1 03/04/2016 0504   CL 100 (L) 03/04/2016 0504   CO2 29 03/04/2016 0504   GLUCOSE 90 03/04/2016 0504   BUN 7 03/04/2016 0504   CREATININE 0.65 03/04/2016 0504   CALCIUM 8.6 (L) 03/04/2016 0504   PROT 7.5 06/08/2015 1618   ALBUMIN 3.7 06/08/2015 1618   AST 17 06/08/2015 1618   ALT 16 06/08/2015 1618   ALKPHOS 52 06/08/2015 1618   BILITOT 0.3 06/08/2015 1618   GFRNONAA >60 03/04/2016 0504   GFRAA >60 03/04/2016 0504   Lab Results  Component Value Date   CHOL 176 06/08/2015   HDL 40.90 06/08/2015   LDLCALC 112 (H) 06/08/2015   TRIG 114.0 06/08/2015   CHOLHDL 4  06/08/2015   No results found for: HGBA1C Lab Results  Component Value Date   VITAMINB12 239 06/08/2015   Lab Results  Component Value Date   TSH 0.93 06/08/2015       ASSESSMENT AND PLAN  43 y.o. year old female here with intermittent numbness, cramps, pain in arms and legs since hysterectomy in 2017.  Also with anxiety insomnia.  We will proceed with further work-up.  Dx:  1. Muscle cramps   2. Numbness     PLAN:  NUMBNESS / CRAMPS - neuropathy, myopathy, radiculopathy workup  INSOMNIA / ANXIETY (4 hours sleep per night) - consider psychiatry / psychology evaluation  Orders Placed This Encounter  Procedures  . MR CERVICAL SPINE W WO CONTRAST  . MR LUMBAR SPINE WO CONTRAST  . CK  . Aldolase  . Vitamin B12  . Hemoglobin A1c  . TSH   Return pending test results, for pending if symptoms worsen or fail to improve.    Penni Bombard, MD [id number], [id number] AM Certified in Neurology, Neurophysiology and Neuroimaging  [facility] [address] [place], [place] [address] [phone]

## 2019-09-21 NOTE — Telephone Encounter (Signed)
[employer] order sent to GI. They will obtain the auth and reach out to the patient to schedule.  

## 2019-09-22 LAB — VITAMIN B12: Vitamin B-12: 535 pg/mL (ref 232–1245)

## 2019-09-22 LAB — HEMOGLOBIN A1C
Est. average glucose Bld gHb Est-mCnc: 111 mg/dL
Hgb A1c MFr Bld: 5.5 % (ref 4.8–5.6)

## 2019-09-22 LAB — CK: Total CK: 68 U/L (ref 32–182)

## 2019-09-22 LAB — TSH: TSH: 0.422 u[IU]/mL — ABNORMAL LOW (ref 0.450–4.500)

## 2019-09-22 LAB — ALDOLASE: Aldolase: 5.1 U/L (ref 3.3–10.3)

## 2019-09-26 ENCOUNTER — Telehealth: Payer: Self-pay | Admitting: *Deleted

## 2019-09-27 NOTE — Telephone Encounter (Signed)
Called patient and informed her that her labs are normal except for borderline low thyroid. Dr Leta Baptist recommends she FU with PCP in 4-6 weeks for repeat and possible additional thyroid labs. She  verbalized understanding, appreciation.

## 2019-09-27 NOTE — Telephone Encounter (Signed)
TSH is just borderline low. Needs pcp follow up in 4-6 weeks. _VRP

## 2019-10-11 ENCOUNTER — Other Ambulatory Visit: Payer: BC Managed Care – PPO

## 2019-10-13 NOTE — Telephone Encounter (Signed)
[employer] did not approve either of the MRI's.  "Based on [employer] guidelines Neck (Cervical spine) pain without and with Neurological features (including stenosis), we cannot approve this request. Your records show that ou have changes in the feeling in your arms and/or hands. The reason this request cannot be approved is because. 1. Imaging is not supported without results of follow up contact with your doctor showing that you failed to improve after a six week trial of doctor prescribed treatment. Follow up contact may be done by phone, mail or messaging. Your records do not describe these results. 2. Imaging may be supported if you failed to improve after a recent ( within three months) six week trial of doctor prescribed treatment."   "Based on [employer] guidelines lower extremity pain with neurological features (radiculopathy, radiculitis, or plexopathy and neuropathy) with or without low back ( lumbar spine) pain, we cannot approve this request. Your records show that you are having lower back pain that travels to your hip and/or leg. The reason this request cannot be approve is because. 1. Imaging is not supported without results of follow up contact with your doctor contact may be done by phone, mail or messaging. Your records do not describe these results. 2. Imaging may be supported if your failed to improve following a recent (within three months) six week trial of doctor prescribed treatment."  There is an option to do a peer to peer. The phone number is [phone] option 4. The case number is [id number]. It does have to be scheduled for it to be done. The deadline for it be scheduled by is 10/26/19.

## 2019-10-17 NOTE — Telephone Encounter (Signed)
Called [employer], spoke with Cornelius Moras to schedule peer to peer for Case [id number], MRI c spine w/wo, MRI L spine WO.  She scheduled P2P on 10/19/19, 4 pm est, Dr Quay Burow. Dr Quay Burow will contact Dr Leta Baptist via [facility] mobile #, office # as back up.

## 2019-10-19 ENCOUNTER — Telehealth: Payer: Self-pay | Admitting: *Deleted

## 2019-10-19 ENCOUNTER — Telehealth: Payer: Self-pay | Admitting: Diagnostic Neuroimaging

## 2019-10-19 NOTE — Telephone Encounter (Addendum)
Patient called back and stated yesterday she couldn't get out of bed due to cramping in her arms, legs and lower back pain. She stated she is no better at this point. Thanked her for information. Patient verbalized understanding, appreciation.

## 2019-10-19 NOTE — Telephone Encounter (Signed)
Per Dr Leta Baptist, called patient and LVM requesting call back today. Advised her he will do a peer to peer with her insurance this afternoon to get her MRIs approved. He needs to know how she is doing.

## 2019-10-19 NOTE — Telephone Encounter (Signed)
Peer to peer review with Dr Quay Burow.  Patient has tried and failed OTC meds and home exercises.   Scans approved.   Approval: [id number]   Penni Bombard, MD [id number], [id number] PM Certified in Neurology, Neurophysiology and Neuroimaging  [facility] [address] [place], [place] [address] [phone]

## 2019-10-24 NOTE — Telephone Encounter (Signed)
Patient is scheduled for MRI's already. DW

## 2019-11-17 ENCOUNTER — Other Ambulatory Visit: Payer: BC Managed Care – PPO

## 2019-12-13 ENCOUNTER — Ambulatory Visit
Admission: RE | Admit: 2019-12-13 | Discharge: 2019-12-13 | Disposition: A | Discharge status: Home / Self Care | Payer: BC Managed Care – PPO | Source: Ambulatory Visit | Attending: Diagnostic Neuroimaging | Admitting: Diagnostic Neuroimaging

## 2019-12-13 DIAGNOSIS — R200 Anesthesia of skin: Secondary | ICD-10-CM

## 2019-12-13 DIAGNOSIS — R252 Cramp and spasm: Secondary | ICD-10-CM

## 2019-12-13 MED ORDER — GADOBUTROL 1 MMOL/ML IV SOLN
10.0000 mL | Freq: Once | INTRAVENOUS | Status: AC | PRN
  Administered 2019-12-13: 13:00:00 10 mL via INTRAVENOUS

## 2019-12-15 ENCOUNTER — Telehealth: Payer: Self-pay | Admitting: *Deleted

## 2019-12-15 NOTE — Telephone Encounter (Signed)
Called patient and LVM informing her that her MRI lumbar spine showed a mild possible pinched nerve affecting left side only. Dr Leta Baptist recommends conservative management. Her MRI cervical spine results are unremarkable. Advised we are closed on Friday, left # for questions.

## 2020-06-10 IMAGING — MR MR CERVICAL SPINE WO/W CM
4 of 8 series · 23 of 48 positions shown · IV contrast (10 ml Gadavist)
Comparison: None.
COMPARISON: None.

Addendum:
CLINICAL DATA: Intermittent cramping.  Tingling and paresthesia.

EXAM:
MRI CERVICAL SPINE WITHOUT AND WITH CONTRAST
TECHNIQUE: Multiplanar and multiecho pulse sequences of the cervical spine, to
include the craniocervical junction and cervicothoracic junction,
were obtained without and with intravenous contrast.
CONTRAST:  10mL GADAVIST GADOBUTROL 1 MMOL/ML IV SOLN

[Series 3: T2 post-contrast · sagittal · 3.3mm · 0.37mm/px · 4 of 13 slices shown]
[im 1/13]
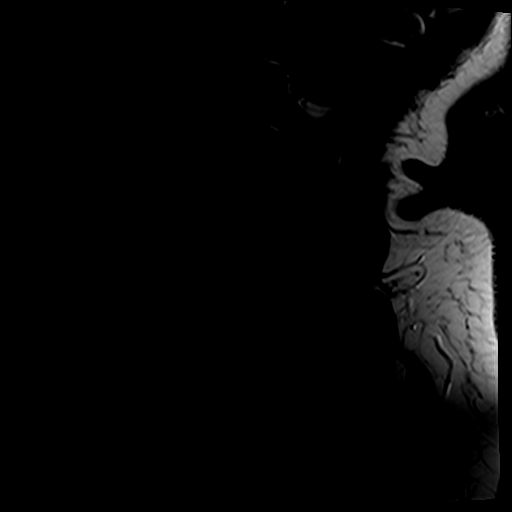
[im 5/13]
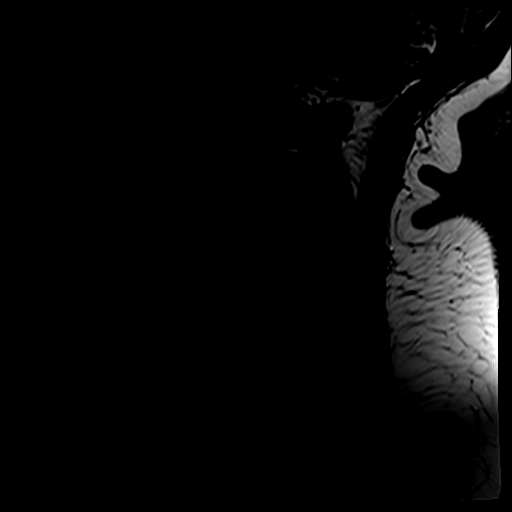
[im 9/13]
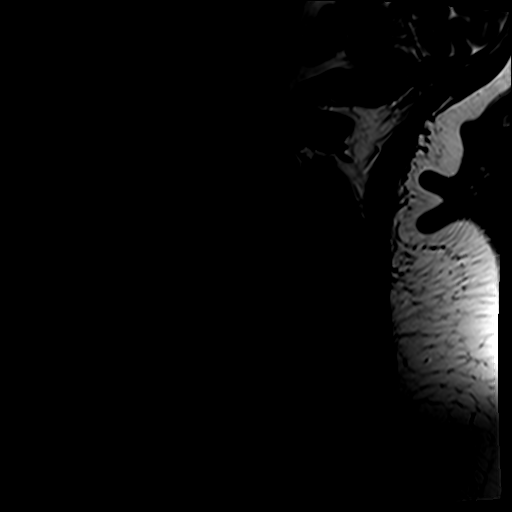
[im 13/13]
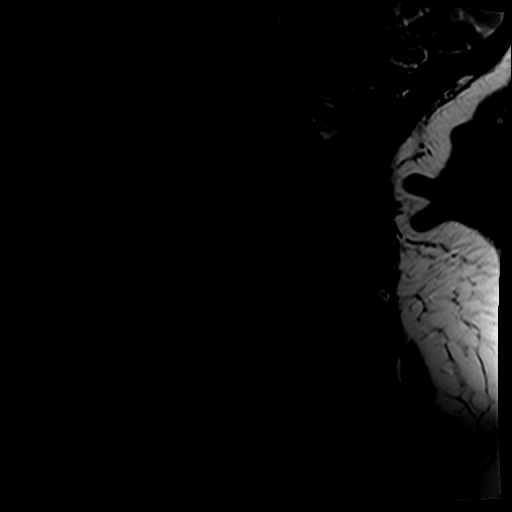

[Series 4: T1 · sagittal · 3.3mm · 0.37mm/px · 4 of 13 slices shown (1 of 2)]
[im 1/13]
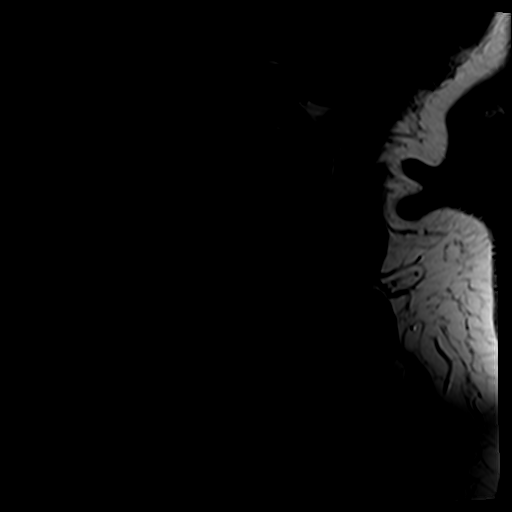
[im 5/13]
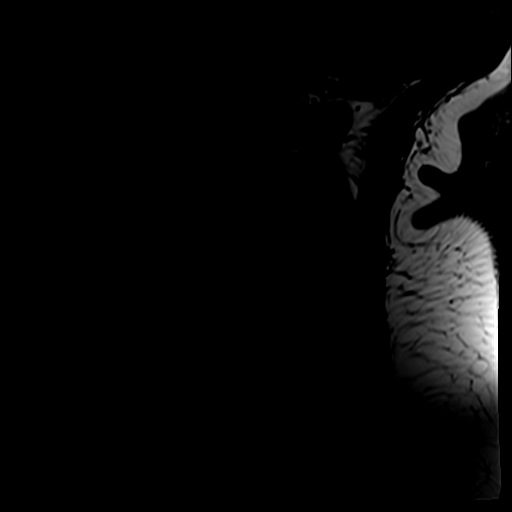
[im 9/13]
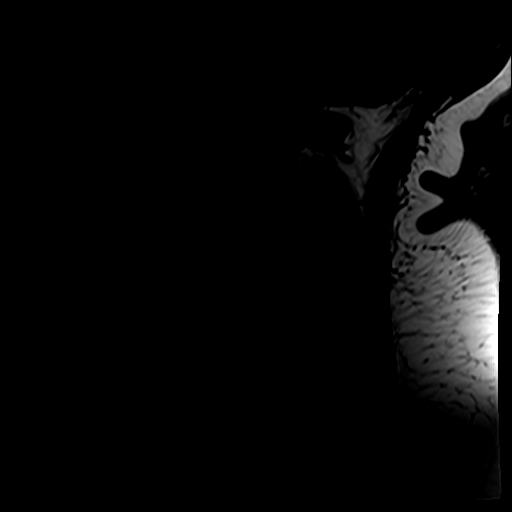
[im 13/13]
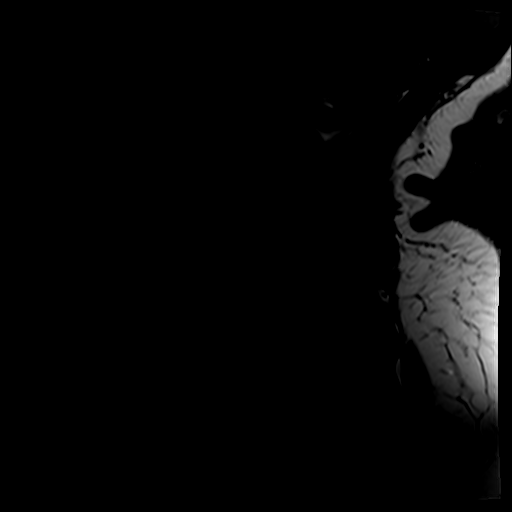

[Series 7: T2 · axial · 3.0mm · 0.70mm/px · z∈[+10,+103]mm · 8 of 26 slices shown]
[im 1/26]
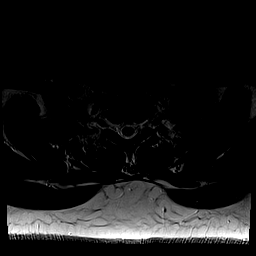
[im 4/26]
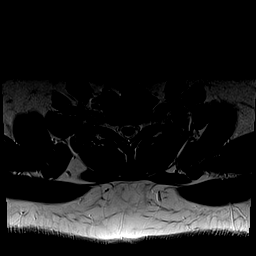
[im 8/26]
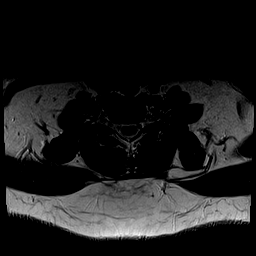
[im 11/26]
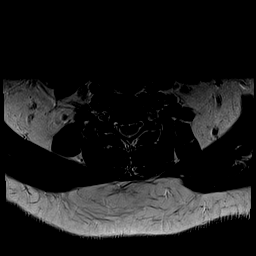
[im 15/26]
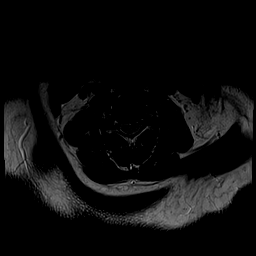
[im 18/26]
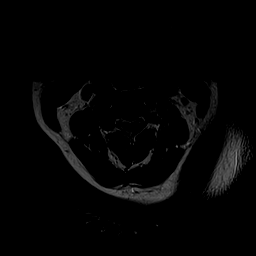
[im 22/26]
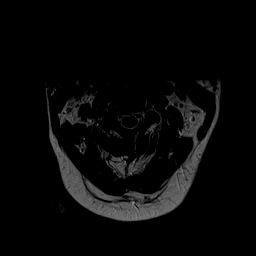
[im 26/26]
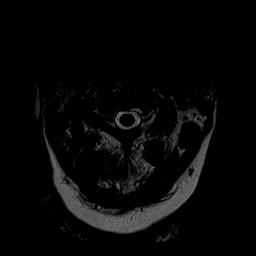

[Series 8: T1 · axial · non-contrast · 3.0mm · 0.35mm/px · z∈[+10,+88]mm · 7 of 26 slices shown (2 of 2)]
[im 1/26]
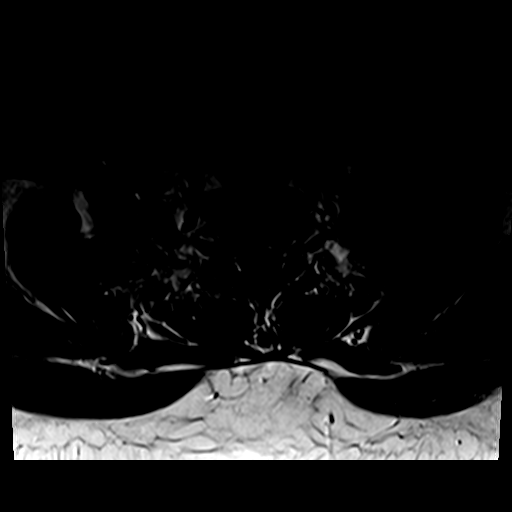
[im 4/26]
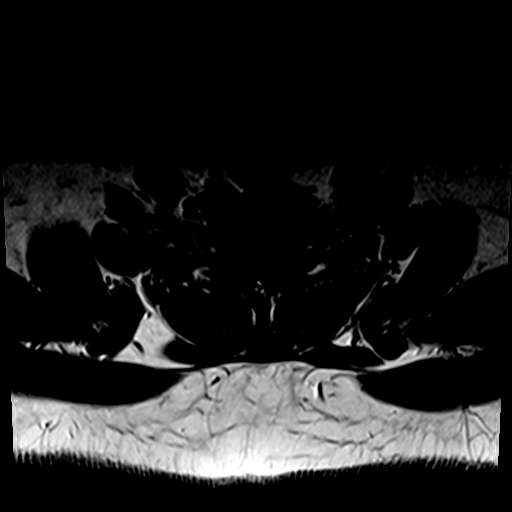
[im 8/26]
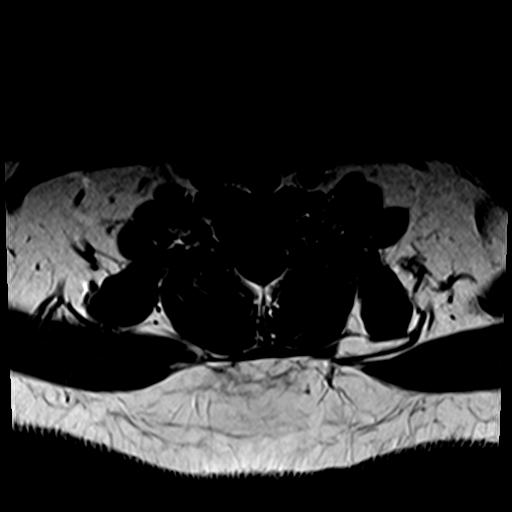
[im 11/26]
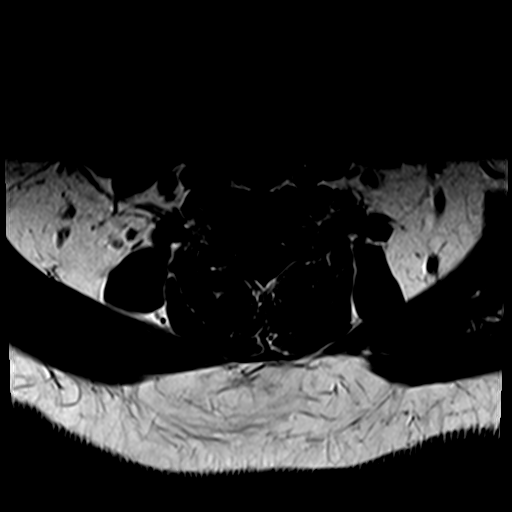
[im 15/26]
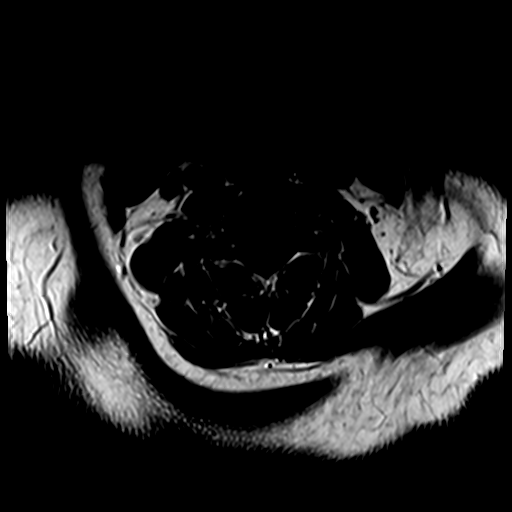
[im 18/26]
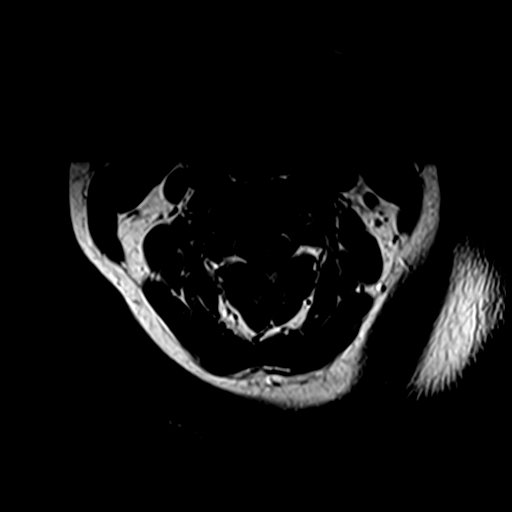
[im 22/26]
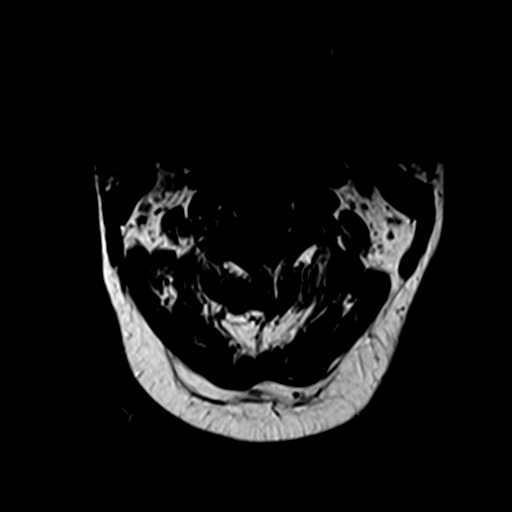

[23 of 48 positions shown; findings below may reference images not displayed]

FINDINGS: Alignment: Straightening of the normal cervical lordosis.

Vertebrae: No fracture or primary bone lesion.

Cord: No primary cord lesion. No abnormal signal or abnormal
enhancement. See below regarding stenosis.

Posterior Fossa, vertebral arteries, paraspinal tissues: Negative

Disc levels:

Foramen magnum is widely patent.  C1-2 is normal.  C2-3 is normal.

C3-4, C4-5 and C5-6: Central disc bulges. Narrowing of the ventral
subarachnoid space. AP diameter of the spinal canal at these levels
measures 8 mm in the midline. No evidence of cord compression. Mild
foraminal narrowing. No advanced finding.

C6-7: Minimal disc bulge.  No canal or foraminal stenosis.

C7-T1: Normal interspace.
IMPRESSION: No primary cord pathology. Evidence of demyelinating disease. No
abnormal cord enhancement.

Degenerative spondylosis at C4-5, C5-6 and C6-7 with disc bulges.
Narrowing of the subarachnoid space at those levels but without
frank cord compression. AP diameter of the canal measures 8 mm. Mild
bilateral foraminal narrowing at those levels. Definite neural
compression is not established, but nerve irritation could occur.

ADDENDUM:
Speech recognition error in the initial impression. Initial
impression should read as follows:

No primary cord pathology. No evidence of demyelinating disease. No
abnormal cord enhancement.

Second impression is correct.

Thank you for bringing this to my attention.

*** End of Addendum ***
FINDINGS: Alignment: Straightening of the normal cervical lordosis.

Vertebrae: No fracture or primary bone lesion.

Cord: No primary cord lesion. No abnormal signal or abnormal
enhancement. See below regarding stenosis.

Posterior Fossa, vertebral arteries, paraspinal tissues: Negative

Disc levels:

Foramen magnum is widely patent.  C1-2 is normal.  C2-3 is normal.

C3-4, C4-5 and C5-6: Central disc bulges. Narrowing of the ventral
subarachnoid space. AP diameter of the spinal canal at these levels
measures 8 mm in the midline. No evidence of cord compression. Mild
foraminal narrowing. No advanced finding.

C6-7: Minimal disc bulge.  No canal or foraminal stenosis.

C7-T1: Normal interspace.
IMPRESSION: No primary cord pathology. Evidence of demyelinating disease. No
abnormal cord enhancement.

Degenerative spondylosis at C4-5, C5-6 and C6-7 with disc bulges.
Narrowing of the subarachnoid space at those levels but without
frank cord compression. AP diameter of the canal measures 8 mm. Mild
bilateral foraminal narrowing at those levels. Definite neural
compression is not established, but nerve irritation could occur.

## 2020-08-06 ENCOUNTER — Telehealth: Payer: Self-pay | Admitting: Neurology

## 2020-08-06 DIAGNOSIS — R252 Cramp and spasm: Secondary | ICD-10-CM

## 2020-08-06 NOTE — Addendum Note (Signed)
Addended by: Andrey Spearman R on: 08/06/2020 05:17 PM   Modules accepted: Orders

## 2020-08-06 NOTE — Telephone Encounter (Signed)
Options include sports med eval, PT, or pain mgmt referral. -VRP

## 2020-08-06 NOTE — Telephone Encounter (Signed)
Patient called after-hours call center yesterday on 08/05/2020.  When I called back, it went directly to her voicemail, since it did not identify her specifically, I did not leave a voice message.  She called back and I was able to connect with her.  She reports that her back pain has become worse.  It is particularly noticeable when she exercises.  She reported that she had been seen about 6 months ago, in March or April 2021.  She had had an MRI which showed a pinched nerve type changes and she was wondering if she could receive treatment for this.  She reports that when she came to the clinic appointment she was on a muscle relaxer.  She had taken this for another condition.  She also improved a little bit but has become worse.  I had not opened her chart yet at the time.  I explained to her that there may be several different options available for her.  She may need to come in for an office visit for better evaluation.  Sometimes symptomatic medication or physical therapy can be considered or injection into the lower back.  Her back pain is mostly radiating to her left leg.  She was advised that I would let Dr. Leta Baptist know as well as his nurse and they would reach out to her.  She was agreeable to this.  In addition, she was encouraged to send a message through my chart.  She is not sure if she had signed up for my chart.  After reviewing the chart, patient was last seen in this clinic in October.  She had an MRI of the cervical and lumbar spine in January 2021.    Please review and advise.

## 2020-08-06 NOTE — Telephone Encounter (Signed)
Orders Placed This Encounter  Procedures  . Ambulatory referral to [place], MD 2/30/0979, 4:99 PM Certified in Neurology, Neurophysiology and Neuroimaging  [facility] [address] [place], [place] [address] [phone]

## 2020-08-06 NOTE — Telephone Encounter (Signed)
Spoke with patient and advised her of Dr Gladstone Lighter recommendations. She would like to be referred to pain management. She doesn't have a PCP; I advised Dr Leta Baptist will be glad to send in referral. Patient verbalized understanding, appreciation.

## 2020-08-07 ENCOUNTER — Encounter: Payer: Self-pay | Admitting: Physical Medicine & Rehabilitation

## 2020-08-30 ENCOUNTER — Encounter
Payer: BC Managed Care – PPO | Attending: Physical Medicine & Rehabilitation | Admitting: Physical Medicine & Rehabilitation

## 2020-10-04 ENCOUNTER — Telehealth: Payer: Self-pay

## 2020-10-04 NOTE — Telephone Encounter (Signed)
Patient was last seen by you on 07.01.2016 and was wondering if she could re-establish care. Just let me know. Thank you

## 2020-10-05 NOTE — Telephone Encounter (Signed)
Ok. Thanks!

## 2020-10-24 ENCOUNTER — Ambulatory Visit (INDEPENDENT_AMBULATORY_CARE_PROVIDER_SITE_OTHER): Payer: BC Managed Care – PPO | Admitting: Internal Medicine

## 2020-10-24 ENCOUNTER — Encounter: Payer: Self-pay | Admitting: Internal Medicine

## 2020-10-24 ENCOUNTER — Other Ambulatory Visit: Payer: Self-pay

## 2020-10-24 VITALS — BP 128/70 | HR 98 | Temp 98.5°F | Ht 63.0 in | Wt 225.6 lb

## 2020-10-24 DIAGNOSIS — R635 Abnormal weight gain: Secondary | ICD-10-CM | POA: Diagnosis not present

## 2020-10-24 DIAGNOSIS — R4586 Emotional lability: Secondary | ICD-10-CM

## 2020-10-24 DIAGNOSIS — E559 Vitamin D deficiency, unspecified: Secondary | ICD-10-CM | POA: Diagnosis not present

## 2020-10-24 DIAGNOSIS — Z0000 Encounter for general adult medical examination without abnormal findings: Secondary | ICD-10-CM

## 2020-10-24 DIAGNOSIS — Z0001 Encounter for general adult medical examination with abnormal findings: Secondary | ICD-10-CM

## 2020-10-24 DIAGNOSIS — R5383 Other fatigue: Secondary | ICD-10-CM

## 2020-10-24 HISTORY — DX: Other fatigue: R53.83

## 2020-10-24 HISTORY — DX: Abnormal weight gain: R63.5

## 2020-10-24 NOTE — Assessment & Plan Note (Addendum)
  We discussed age appropriate health related issues, including available/recomended screening tests and vaccinations. Labs were ordered to be later reviewed . All questions were answered. We discussed one or more of the following - seat belt use, use of sunscreen/sun exposure exercise, safe sex, fall risk reduction, second hand smoke exposure, firearm use and storage, seat belt use, a need for adhering to healthy diet and exercise. Labs were ordered.  All questions were answered. 2017 complete hysterectomy

## 2020-10-24 NOTE — Assessment & Plan Note (Addendum)
2017 complete hysterectomy Since 2017- on Progesterone for moods - taper off and d/c

## 2020-10-24 NOTE — Progress Notes (Signed)
Subjective:  Patient ID: Meghan [name], female    DOB: 1976/07/19  Age: 44 y.o. MRN: [id number]  CC: Establish Care   HPI DASHIA CALDEIRA presents for a well exam (new pt - not sen in >3 years) C/o wt gain since 2017 C/o fatigue x 2 wks C/o moodiness -  on Progesterone for moods after hysterectomy in 2017  Outpatient Medications Prior to Visit  Medication Sig Dispense Refill  . ALPRAZolam (XANAX) 1 MG tablet alprazolam 1 mg tablet  TAKE 1 TABLET BY MOUTH THREE TIMES A DAY    . pentosan polysulfate (ELMIRON) 100 MG capsule Elmiron 100 mg capsule  Take 1 capsule twice a day by oral route.    . progesterone (PROMETRIUM) 100 MG capsule progesterone micronized 100 mg capsule  Take 1 capsule every day by oral route.    Marland Kitchen zolpidem (AMBIEN) 10 MG tablet zolpidem 10 mg tablet  TAKE 1 TABLET BY MOUTH EVERYDAY AT BEDTIME.    Marland Kitchen alprazolam (XANAX) 2 MG tablet Take 2 mg by mouth every 8 (eight) hours as needed. for anxiety (Patient not taking: Reported on 10/24/2020)  1  . traZODone (DESYREL) 50 MG tablet trazodone 50 mg tablet  TAKE 1 TABLET BY MOUTH EVERY DAY (Patient not taking: Reported on 10/24/2020)     No facility-administered medications prior to visit.    ROS: Review of Systems  Constitutional: Positive for fatigue and unexpected weight change. Negative for activity change, appetite change and chills.  HENT: Negative for congestion, mouth sores and sinus pressure.   Eyes: Negative for visual disturbance.  Respiratory: Negative for cough and chest tightness.   Gastrointestinal: Negative for abdominal pain and nausea.  Genitourinary: Negative for difficulty urinating, frequency and vaginal pain.  Musculoskeletal: Negative for back pain and gait problem.  Skin: Negative for pallor and rash.  Neurological: Negative for dizziness, tremors, weakness, numbness and headaches.  Psychiatric/Behavioral: Positive for dysphoric mood. Negative for confusion, sleep disturbance and suicidal  ideas. The patient is nervous/anxious.     Objective:  BP 128/70 (BP Location: Left Arm)   Pulse 98   Temp 98.5 F (36.9 C) (Oral)   Ht 5\' 3"  (1.6 m)   Wt 225 lb 9.6 oz (102.3 kg)   LMP 01/28/2016   SpO2 98%   BMI 39.96 kg/m   BP Readings from Last 3 Encounters:  10/24/20 128/70  09/21/19 99/69  03/05/16 111/61    Wt Readings from Last 3 Encounters:  10/24/20 225 lb 9.6 oz (102.3 kg)  09/21/19 204 lb 8 oz (92.8 kg)  03/03/16 198 lb (89.8 kg)    Physical Exam Constitutional:      General: She is not in acute distress.    Appearance: She is well-developed. She is obese.  HENT:     Head: Normocephalic.     Right Ear: External ear normal.     Left Ear: External ear normal.     Nose: Nose normal.  Eyes:     General:        Right eye: No discharge.        Left eye: No discharge.     Conjunctiva/sclera: Conjunctivae normal.     Pupils: Pupils are equal, round, and reactive to light.  Neck:     Thyroid: No thyromegaly.     Vascular: No JVD.     Trachea: No tracheal deviation.  Cardiovascular:     Rate and Rhythm: Normal rate and regular rhythm.     Heart sounds: Normal  heart sounds.  Pulmonary:     Effort: No respiratory distress.     Breath sounds: No stridor. No wheezing.  Abdominal:     General: Bowel sounds are normal. There is no distension.     Palpations: Abdomen is soft. There is no mass.     Tenderness: There is no abdominal tenderness. There is no guarding or rebound.  Musculoskeletal:        General: No tenderness.     Cervical back: Normal range of motion and neck supple.  Lymphadenopathy:     Cervical: No cervical adenopathy.  Skin:    Findings: No erythema or rash.  Neurological:     Cranial Nerves: No cranial nerve deficit.     Motor: No abnormal muscle tone.     Coordination: Coordination normal.     Deep Tendon Reflexes: Reflexes normal.  Psychiatric:        Behavior: Behavior normal.        Thought Content: Thought content normal.         Judgment: Judgment normal.    I spent 22 minutes in addition to time for CPX wellness examination in preparing to see the patient by review of recent labs, imaging and procedures, obtaining and reviewing separately obtained history, communicating with the patient, ordering medications, tests or procedures, and documenting clinical information in the EHR including the differential diagnosis, treatment, and any further evaluation and other management of fatigue, wt gain.         Lab Results  Component Value Date   WBC 14.5 (H) 03/04/2016   HGB 11.9 (L) 03/04/2016   HCT 35.7 (L) 03/04/2016   PLT 289 03/04/2016   GLUCOSE 90 03/04/2016   CHOL 176 06/08/2015   TRIG 114.0 06/08/2015   HDL 40.90 06/08/2015   LDLCALC 112 (H) 06/08/2015   ALT 16 06/08/2015   AST 17 06/08/2015   NA 137 03/04/2016   K 4.1 03/04/2016   CL 100 (L) 03/04/2016   CREATININE 0.65 03/04/2016   BUN 7 03/04/2016   CO2 29 03/04/2016   TSH 0.422 (L) 09/21/2019   HGBA1C 5.5 09/21/2019    MR LUMBAR SPINE WO CONTRAST  Result Date: 12/14/2019 [facility] NEUR[facility] REPORT STUDY DATE: 12/13/19 PATIENT NAME: Meghan [name] DOB: 1976-01-15 MRN: [id number] ORDERING CLINICIAN: Penni Bombard, MD CLINICAL HISTORY: 44 year old female with numbness. EXAM: MR LUMBAR SPINE WO CONTRAST TECHNIQUE: MRI of the lumbar spine was obtained utilizing 4 mm sagittal slices from G66-59 down to the lower sacrum with T1, T2 and inversion recovery views. In addition 4 mm axial slices from D3-5 down to L5-S1 level were included with T1 and T2 weighted views. CONTRAST: none COMPARISON: none IMAGING SITE: [facility] [address] (1.5 Tesla MRI)  FINDINGS: On sagittal views the vertebral bodies have normal height and alignment.  The conus medullaris terminates at the level of L2.  On axial views: L1-2: no spinal stenosis or foraminal narrowing L2-3: no spinal stenosis or foraminal narrowing L3-4: no spinal stenosis  or foraminal narrowing L4-5: leftward disc bulging with moderate left foraminal stenosis L5-S1: no spinal stenosis or foraminal narrowing Limited views of the aorta, kidneys, iliopsoas muscles and sacroiliac joints are unremarkable.   MRI lumbar spine (without) demonstrating: - L4-5: leftward disc bulging with moderate left foraminal stenosis. INTERPRETING PHYSICIAN: Penni Bombard, MD Certified in Neurology, Neurophysiology and Neuroimaging [facility] [address] [place], [place] [address] [phone]   [place]  W WO CONTRAST  Addendum Date: 12/16/2019   ADDENDUM REPORT: 12/16/2019 08:05 ADDENDUM: Speech recognition error in the initial impression. Initial impression should read as follows: No primary cord pathology. No evidence of demyelinating disease. No abnormal cord enhancement. Second impression is correct. Thank you for bringing this to my attention. Electronically Signed   By: Nelson Chimes M.D.   On: 12/16/2019 08:05   Result Date: 12/16/2019 CLINICAL DATA:  Intermittent cramping.  Tingling and paresthesia. EXAM: MRI CERVICAL SPINE WITHOUT AND WITH CONTRAST TECHNIQUE: Multiplanar and multiecho pulse sequences of the cervical spine, to include the craniocervical junction and cervicothoracic junction, were obtained without and with intravenous contrast. CONTRAST:  69mL GADAVIST GADOBUTROL 1 MMOL/ML IV SOLN COMPARISON:  None. FINDINGS: Alignment: Straightening of the normal cervical lordosis. Vertebrae: No fracture or primary bone lesion. Cord: No primary cord lesion. No abnormal signal or abnormal enhancement. See below regarding stenosis. Posterior Fossa, vertebral arteries, paraspinal tissues: Negative Disc levels: Foramen magnum is widely patent.  C1-2 is normal.  C2-3 is normal. C3-4, C4-5 and C5-6: Central disc bulges. Narrowing of the ventral subarachnoid space. AP diameter of the spinal canal at these levels measures 8 mm in the midline. No evidence of  cord compression. Mild foraminal narrowing. No advanced finding. C6-7: Minimal disc bulge.  No canal or foraminal stenosis. C7-T1: Normal interspace. IMPRESSION: No primary cord pathology. Evidence of demyelinating disease. No abnormal cord enhancement. Degenerative spondylosis at C4-5, C5-6 and C6-7 with disc bulges. Narrowing of the subarachnoid space at those levels but without frank cord compression. AP diameter of the canal measures 8 mm. Mild bilateral foraminal narrowing at those levels. Definite neural compression is not established, but nerve irritation could occur. Electronically Signed: By: Nelson Chimes M.D. On: 12/13/2019 14:02    Assessment & Plan:    Walker Kehr, MD

## 2020-10-24 NOTE — Assessment & Plan Note (Signed)
Since 2017 post-hysterectomy. On Progesterone for moods - taper off and d/c

## 2020-10-24 NOTE — Assessment & Plan Note (Signed)
Vit d 

## 2020-10-31 ENCOUNTER — Other Ambulatory Visit: Payer: BC Managed Care – PPO

## 2020-10-31 LAB — URINALYSIS, ROUTINE W REFLEX MICROSCOPIC
Bilirubin Urine: NEGATIVE
Ketones, ur: NEGATIVE
Nitrite: NEGATIVE
Specific Gravity, Urine: >=1.03 — AB (ref 1.000–1.030)
Total Protein, Urine: NEGATIVE
Urine Glucose: NEGATIVE
Urobilinogen, UA: 0.2 (ref 0.0–1.0)
pH: 6 (ref 5.0–8.0)

## 2020-10-31 LAB — LIPID PANEL
Cholesterol: 174 mg/dL (ref 0–200)
HDL: 42.7 mg/dL (ref >39.00)
LDL Cholesterol: 116 mg/dL — ABNORMAL HIGH (ref 0–99)
NonHDL: 131.23
Total CHOL/HDL Ratio: 4
Triglycerides: 77 mg/dL (ref 0.0–149.0)
VLDL: 15.4 mg/dL (ref 0.0–40.0)

## 2020-10-31 LAB — CBC WITH DIFFERENTIAL/PLATELET
Basophils Absolute: 0 10*3/uL (ref 0.0–0.1)
Basophils Relative: 0.5 % (ref 0.0–3.0)
Eosinophils Absolute: 0.4 10*3/uL (ref 0.0–0.7)
Eosinophils Relative: 4.8 % (ref 0.0–5.0)
HCT: 41.2 % (ref 36.0–46.0)
Hemoglobin: 13.8 g/dL (ref 12.0–15.0)
Lymphocytes Relative: 33.5 % (ref 12.0–46.0)
Lymphs Abs: 3.1 10*3/uL (ref 0.7–4.0)
MCHC: 33.5 g/dL (ref 30.0–36.0)
MCV: 90.3 fl (ref 78.0–100.0)
Monocytes Absolute: 0.8 10*3/uL (ref 0.1–1.0)
Monocytes Relative: 8.3 % (ref 3.0–12.0)
Neutro Abs: 4.9 10*3/uL (ref 1.4–7.7)
Neutrophils Relative %: 52.9 % (ref 43.0–77.0)
Platelets: 336 10*3/uL (ref 150.0–400.0)
RBC: 4.56 Mil/uL (ref 3.87–5.11)
RDW: 13.5 % (ref 11.5–15.5)
WBC: 9.2 10*3/uL (ref 4.0–10.5)

## 2020-10-31 LAB — VITAMIN D 25 HYDROXY (VIT D DEFICIENCY, FRACTURES): VITD: 17.6 ng/mL — ABNORMAL LOW (ref 30.00–100.00)

## 2020-10-31 LAB — COMPREHENSIVE METABOLIC PANEL
ALT: 14 U/L (ref 0–35)
AST: 16 U/L (ref 0–37)
Albumin: 3.7 g/dL (ref 3.5–5.2)
Alkaline Phosphatase: 60 U/L (ref 39–117)
BUN: 9 mg/dL (ref 6–23)
CO2: 24 mEq/L (ref 19–32)
Calcium: 9.1 mg/dL (ref 8.4–10.5)
Chloride: 104 mEq/L (ref 96–112)
Creatinine, Ser: 0.94 mg/dL (ref 0.40–1.20)
GFR: 73.86 mL/min (ref >60.00)
Glucose, Bld: 76 mg/dL (ref 70–99)
Potassium: 3.8 mEq/L (ref 3.5–5.1)
Sodium: 137 mEq/L (ref 135–145)
Total Bilirubin: 0.3 mg/dL (ref 0.2–1.2)
Total Protein: 7.5 g/dL (ref 6.0–8.3)

## 2020-10-31 LAB — T4, FREE: Free T4: 0.65 ng/dL (ref 0.60–1.60)

## 2020-10-31 LAB — VITAMIN B12: Vitamin B-12: 358 pg/mL (ref 211–911)

## 2020-10-31 LAB — TSH: TSH: 1.37 u[IU]/mL (ref 0.35–4.50)

## 2020-10-31 NOTE — Addendum Note (Signed)
Addended by: Cresenciano Lick on: 10/31/2020 10:20 AM   Modules accepted: Orders

## 2020-11-07 ENCOUNTER — Other Ambulatory Visit: Payer: Self-pay | Admitting: Internal Medicine

## 2020-11-07 MED ORDER — VITAMIN D3 50 MCG (2000 UT) PO CAPS: 2000.0000 [IU] | ORAL_CAPSULE | Freq: Every day | ORAL | 3 refills | Status: DC

## 2020-11-07 MED ORDER — CEFUROXIME AXETIL 250 MG PO TABS: 250.0000 mg | ORAL_TABLET | Freq: Two times a day (BID) | ORAL | 0 refills | Status: DC

## 2020-11-07 MED ORDER — VITAMIN D3 1.25 MG (50000 UT) PO CAPS: 1.0000 | ORAL_CAPSULE | ORAL | 0 refills | Status: DC

## 2020-11-27 ENCOUNTER — Other Ambulatory Visit: Payer: Self-pay | Admitting: Internal Medicine

## 2020-12-05 ENCOUNTER — Other Ambulatory Visit: Payer: Self-pay

## 2020-12-05 ENCOUNTER — Encounter: Payer: Self-pay | Admitting: Internal Medicine

## 2020-12-05 ENCOUNTER — Ambulatory Visit (INDEPENDENT_AMBULATORY_CARE_PROVIDER_SITE_OTHER): Payer: BC Managed Care – PPO | Admitting: Internal Medicine

## 2020-12-05 DIAGNOSIS — Z90710 Acquired absence of both cervix and uterus: Secondary | ICD-10-CM

## 2020-12-05 DIAGNOSIS — F419 Anxiety disorder, unspecified: Secondary | ICD-10-CM

## 2020-12-05 DIAGNOSIS — R635 Abnormal weight gain: Secondary | ICD-10-CM

## 2020-12-05 DIAGNOSIS — E559 Vitamin D deficiency, unspecified: Secondary | ICD-10-CM

## 2020-12-05 MED ORDER — ALPRAZOLAM 1 MG PO TABS
0.5000 mg | ORAL_TABLET | Freq: Two times a day (BID) | ORAL | 1 refills | Status: DC | PRN
Start: 2020-12-05 — End: 2022-02-21

## 2020-12-05 MED ORDER — ESCITALOPRAM OXALATE 5 MG PO TABS: 5.0000 mg | ORAL_TABLET | Freq: Every day | ORAL | 5 refills | Status: DC

## 2020-12-05 NOTE — Progress Notes (Signed)
Subjective:  Patient ID: Meghan [name], female    DOB: 07-03-1976  Age: 44 y.o. MRN: [id number]  CC: Follow-up   HPI Meghan [name] presents for wt gain, anxiety - worse Working 60 hrs/wk  Outpatient Medications Prior to Visit  Medication Sig Dispense Refill  . ALPRAZolam (XANAX) 1 MG tablet alprazolam 1 mg tablet  TAKE 1 TABLET BY MOUTH THREE TIMES A DAY    . Cholecalciferol (VITAMIN D3) 50 MCG (2000 UT) capsule Take 1 capsule (2,000 Units total) by mouth daily. 100 capsule 3  . zolpidem (AMBIEN) 10 MG tablet zolpidem 10 mg tablet  TAKE 1 TABLET BY MOUTH EVERYDAY AT BEDTIME.    . cefUROXime (CEFTIN) 250 MG tablet Take 1 tablet (250 mg total) by mouth 2 (two) times daily with a meal. (Patient not taking: Reported on 12/05/2020) 10 tablet 0  . Cholecalciferol (VITAMIN D3) 1.25 MG (50000 UT) CAPS Take 1 capsule by mouth once a week. (Patient not taking: Reported on 12/05/2020) 8 capsule 0  . pentosan polysulfate (ELMIRON) 100 MG capsule Elmiron 100 mg capsule  Take 1 capsule twice a day by oral route. (Patient not taking: Reported on 12/05/2020)    . progesterone (PROMETRIUM) 100 MG capsule progesterone micronized 100 mg capsule  Take 1 capsule every day by oral route. (Patient not taking: Reported on 12/05/2020)     No facility-administered medications prior to visit.    ROS: Review of Systems  Constitutional: Negative for activity change, appetite change, chills, fatigue and unexpected weight change.  HENT: Negative for congestion, mouth sores and sinus pressure.   Eyes: Negative for visual disturbance.  Respiratory: Negative for cough and chest tightness.   Gastrointestinal: Negative for abdominal pain and nausea.  Genitourinary: Negative for difficulty urinating, frequency and vaginal pain.  Musculoskeletal: Negative for back pain and gait problem.  Skin: Negative for pallor and rash.  Neurological: Negative for dizziness, tremors, weakness, numbness and headaches.   Psychiatric/Behavioral: Positive for dysphoric mood. Negative for confusion and sleep disturbance. The patient is nervous/anxious.     Objective:  BP 106/70   Pulse 88   Ht 5\' 3"  (1.6 m)   Wt 222 lb (100.7 kg)   LMP 01/28/2016   SpO2 97%   BMI 39.33 kg/m   BP Readings from Last 3 Encounters:  12/05/20 106/70  10/24/20 128/70  09/21/19 99/69    Wt Readings from Last 3 Encounters:  12/05/20 222 lb (100.7 kg)  10/24/20 225 lb 9.6 oz (102.3 kg)  09/21/19 204 lb 8 oz (92.8 kg)    Physical Exam Constitutional:      General: She is not in acute distress.    Appearance: She is well-developed. She is obese.  HENT:     Head: Normocephalic.     Right Ear: External ear normal.     Left Ear: External ear normal.     Nose: Nose normal.     Mouth/Throat:     Mouth: Oropharynx is clear and moist.  Eyes:     General:        Right eye: No discharge.        Left eye: No discharge.     Conjunctiva/sclera: Conjunctivae normal.     Pupils: Pupils are equal, round, and reactive to light.  Neck:     Thyroid: No thyromegaly.     Vascular: No JVD.     Trachea: No tracheal deviation.  Cardiovascular:     Rate and Rhythm: Normal rate and regular rhythm.  Heart sounds: Normal heart sounds.  Pulmonary:     Effort: No respiratory distress.     Breath sounds: No stridor. No wheezing.  Abdominal:     General: Bowel sounds are normal. There is no distension.     Palpations: Abdomen is soft. There is no mass.     Tenderness: There is no abdominal tenderness. There is no guarding or rebound.  Musculoskeletal:        General: No tenderness or edema.     Cervical back: Normal range of motion and neck supple.  Lymphadenopathy:     Cervical: No cervical adenopathy.  Skin:    Findings: No erythema or rash.  Neurological:     Cranial Nerves: No cranial nerve deficit.     Motor: No abnormal muscle tone.     Coordination: Coordination normal.     Deep Tendon Reflexes: Reflexes normal.   Psychiatric:        Mood and Affect: Mood and affect normal.        Behavior: Behavior normal.        Thought Content: Thought content normal.        Judgment: Judgment normal.     Lab Results  Component Value Date   WBC 9.2 10/31/2020   HGB 13.8 10/31/2020   HCT 41.2 10/31/2020   PLT 336.0 10/31/2020   GLUCOSE 76 10/31/2020   CHOL 174 10/31/2020   TRIG 77.0 10/31/2020   HDL 42.70 10/31/2020   LDLCALC 116 (H) 10/31/2020   ALT 14 10/31/2020   AST 16 10/31/2020   NA 137 10/31/2020   K 3.8 10/31/2020   CL 104 10/31/2020   CREATININE 0.94 10/31/2020   BUN 9 10/31/2020   CO2 24 10/31/2020   TSH 1.37 10/31/2020   HGBA1C 5.5 09/21/2019    MR LUMBAR SPINE WO CONTRAST  Result Date: 12/14/2019 [facility] NEUROIMAGING REPORT STUDY DATE: 12/13/19 PATIENT NAME: JALENA WOELK DOB: 1976-10-23 MRN: [id number] ORDERING CLINICIAN: Penni Bombard, MD CLINICAL HISTORY: 44 year old female with numbness. EXAM: MR LUMBAR SPINE WO CONTRAST TECHNIQUE: MRI of the lumbar spine was obtained utilizing 4 mm sagittal slices from 0000000 down to the lower sacrum with T1, T2 and inversion recovery views. In addition 4 mm axial slices from Q000111Q down to L5-S1 level were included with T1 and T2 weighted views. CONTRAST: none COMPARISON: none IMAGING SITE: [facility] [address] (1.5 Tesla MRI)  FINDINGS: On sagittal views the vertebral bodies have normal height and alignment.  The conus medullaris terminates at the level of L2.  On axial views: L1-2: no spinal stenosis or foraminal narrowing L2-3: no spinal stenosis or foraminal narrowing L3-4: no spinal stenosis or foraminal narrowing L4-5: leftward disc bulging with moderate left foraminal stenosis L5-S1: no spinal stenosis or foraminal narrowing Limited views of the aorta, kidneys, iliopsoas muscles and sacroiliac joints are unremarkable.   MRI lumbar spine (without) demonstrating: - L4-5: leftward disc bulging with moderate  left foraminal stenosis. INTERPRETING PHYSICIAN: Penni Bombard, MD Certified in Neurology, Neurophysiology and Neuroimaging [facility] [address] [place], [place] [address] [phone]   MR CERVICAL SPINE W WO CONTRAST  Addendum Date: 12/16/2019   ADDENDUM REPORT: 12/16/2019 08:05 ADDENDUM: Speech recognition error in the initial impression. Initial impression should read as follows: No primary cord pathology. No evidence of demyelinating disease. No abnormal cord enhancement. Second impression is correct. Thank you for bringing this to my attention. Electronically Signed   By:  Nelson Chimes M.D.   On: 12/16/2019 08:05   Result Date: 12/16/2019 CLINICAL DATA:  Intermittent cramping.  Tingling and paresthesia. EXAM: MRI CERVICAL SPINE WITHOUT AND WITH CONTRAST TECHNIQUE: Multiplanar and multiecho pulse sequences of the cervical spine, to include the craniocervical junction and cervicothoracic junction, were obtained without and with intravenous contrast. CONTRAST:  36mL GADAVIST GADOBUTROL 1 MMOL/ML IV SOLN COMPARISON:  None. FINDINGS: Alignment: Straightening of the normal cervical lordosis. Vertebrae: No fracture or primary bone lesion. Cord: No primary cord lesion. No abnormal signal or abnormal enhancement. See below regarding stenosis. Posterior Fossa, vertebral arteries, paraspinal tissues: Negative Disc levels: Foramen magnum is widely patent.  C1-2 is normal.  C2-3 is normal. C3-4, C4-5 and C5-6: Central disc bulges. Narrowing of the ventral subarachnoid space. AP diameter of the spinal canal at these levels measures 8 mm in the midline. No evidence of cord compression. Mild foraminal narrowing. No advanced finding. C6-7: Minimal disc bulge.  No canal or foraminal stenosis. C7-T1: Normal interspace. IMPRESSION: No primary cord pathology. Evidence of demyelinating disease. No abnormal cord enhancement. Degenerative spondylosis at C4-5, C5-6 and C6-7 with disc bulges.  Narrowing of the subarachnoid space at those levels but without frank cord compression. AP diameter of the canal measures 8 mm. Mild bilateral foraminal narrowing at those levels. Definite neural compression is not established, but nerve irritation could occur. Electronically Signed: By: Nelson Chimes M.D. On: 12/13/2019 14:02    Assessment & Plan:   There are no diagnoses linked to this encounter.   No orders of the defined types were placed in this encounter.    Follow-up: No follow-ups on file.  Walker Kehr, MD

## 2020-12-05 NOTE — Assessment & Plan Note (Signed)
Vit D 

## 2020-12-05 NOTE — Patient Instructions (Addendum)
°These suggestions will probably help you to improve your metabolism if you are not overweight and to lose weight if you are overweight: °1.  Reduce your consumption of sugars and starches.  Eliminate high fructose corn syrup from your diet.  Reduce your consumption of processed foods.  For desserts try to have seasonal fruits, berries, nuts, cheeses or dark chocolate with more than 70% cacao. °2.  Do not snack °3.  You do not have to eat breakfast.  If you choose to have breakfast - eat plain greek yogurt, eggs, oatmeal (without sugar) - use honey if you need to. °4.  Drink water, freshly brewed unsweetened tea (green, black or herbal) or coffee.  Do not drink sodas including diet sodas , juices, beverages sweetened with artificial sweeteners. °5.  Reduce your consumption of refined grains. °6.  Avoid protein drinks such as Optifast, Slim fast etc. Eat chicken, fish, meat, dairy and beans for your sources of protein. °7.  Natural unprocessed fats like cold pressed virgin olive oil, butter, coconut oil are good for you.  Eat avocados. °8.  Increase your consumption of fiber.  Fruits, berries, vegetables, whole grains, flaxseed, chia seeds, beans, popcorn, nuts, oatmeal are good sources of fiber °9.  Use vinegar in your diet, i.e. apple cider vinegar, red wine or balsamic vinegar °10.  You can try fasting.  For example you can skip breakfast and lunch every other day (24-hour fast) °11.  Stress reduction, good night sleep, relaxation, meditation, yoga and other physical activity is likely to help you to maintain low weight too. °12.  If you drink alcohol, limit your alcohol intake to no more than 2 drinks a day. ° ° °Mediterranean diet is good for you. ([employer] has a typical Mediterranean cuisine menu) °The Mediterranean diet is a way of eating based on the traditional cuisine of countries bordering the [place] Sea. While there is no single definition of the Mediterranean diet, it is typically high in  vegetables, fruits, whole grains, beans, nut and seeds, and olive oil. °The main components of Mediterranean diet include: °• Daily consumption of vegetables, fruits, whole grains and healthy fats  °• Weekly intake of fish, poultry, beans and eggs  °• Moderate portions of dairy products  °• Limited intake of red meat °Other important elements of the Mediterranean diet are sharing meals with family and friends, enjoying a glass of red wine and being physically active. °Health benefits of a Mediterranean diet: °A traditional Mediterranean diet consisting of large quantities of fresh fruits and vegetables, nuts, fish and olive oil--coupled with physical activity--can reduce your risk of serious mental and physical health problems by: °Preventing heart disease and strokes. Following a Mediterranean diet limits your intake of refined breads, processed foods, and red meat, and encourages drinking red wine instead of hard liquor--all factors that can help prevent heart disease and stroke. °Keeping you agile. If you’re an older adult, the nutrients gained with a Mediterranean diet may reduce your risk of developing muscle weakness and other signs of frailty by about 70 percent. °Reducing the risk of Alzheimer’s. Research suggests that the Mediterranean diet may improve cholesterol, blood sugar levels, and overall blood vessel health, which in turn may reduce your risk of Alzheimer’s disease or dementia. °Halving the risk of Parkinson’s disease. The high levels of antioxidants in the Mediterranean diet can prevent cells from undergoing a damaging process called oxidative stress, thereby cutting the risk of Parkinson’s disease in half. °Increasing longevity. By reducing your risk of   developing heart disease or cancer with the Mediterranean diet, you're reducing your risk of death at any age by 20%. Protecting against type 2 diabetes. A Mediterranean diet is rich in fiber which digests slowly, prevents huge swings in blood  sugar, and can help you maintain a healthy weight.  You can try Lion's Mane Mushroom capsules for memory, focus, neuropathy ([employer] or [employer])

## 2020-12-05 NOTE — Assessment & Plan Note (Addendum)
On Xanax Lion's mane

## 2020-12-05 NOTE — Assessment & Plan Note (Signed)
Status post partial hysterectomy.  She is having hot flashes.  We discussed potential hormonal changes.

## 2020-12-05 NOTE — Assessment & Plan Note (Signed)
Wt Readings from Last 3 Encounters:  12/05/20 222 lb (100.7 kg)  10/24/20 225 lb 9.6 oz (102.3 kg)  09/21/19 204 lb 8 oz (92.8 kg)  Low-carb diet discussed.

## 2020-12-19 ENCOUNTER — Other Ambulatory Visit: Payer: Self-pay | Admitting: Internal Medicine

## 2020-12-25 ENCOUNTER — Telehealth: Payer: Self-pay | Admitting: Internal Medicine

## 2020-12-25 ENCOUNTER — Other Ambulatory Visit: Payer: Self-pay

## 2020-12-25 ENCOUNTER — Telehealth (INDEPENDENT_AMBULATORY_CARE_PROVIDER_SITE_OTHER): Payer: BC Managed Care – PPO | Admitting: Internal Medicine

## 2020-12-25 ENCOUNTER — Encounter: Payer: Self-pay | Admitting: Internal Medicine

## 2020-12-25 DIAGNOSIS — J069 Acute upper respiratory infection, unspecified: Secondary | ICD-10-CM

## 2020-12-25 MED ORDER — AZITHROMYCIN 250 MG PO TABS: ORAL_TABLET | ORAL | 0 refills | Status: DC

## 2020-12-25 MED ORDER — HYDROCODONE-HOMATROPINE 5-1.5 MG/5ML PO SYRP
5.0000 mL | ORAL_SOLUTION | Freq: Three times a day (TID) | ORAL | 0 refills | Status: DC | PRN
Start: 2020-12-25 — End: 2022-02-21

## 2020-12-25 NOTE — Telephone Encounter (Signed)
Patient had a virtual visit today and is wondering if she could have a note to be out of work for a few days until she gets better

## 2020-12-25 NOTE — Assessment & Plan Note (Signed)
Upper respiratory tract infection.  The patient was instructed to get a COVID-19 test.  I sent a prescription for a Z-Pak and Hycodan cough syrup.  She will take vitamin C and zinc.  Push fluids.  Call if not better.  She will quarantine herself until COVID-19 test is available.

## 2020-12-25 NOTE — Progress Notes (Signed)
Virtual Visit via Telephone Note  I connected with Meghan [name] on 12/25/20 at  8:30 AM EST by telephone and verified that I am speaking with the correct person using two identifiers.  Location: Patient: Home Provider: Home   I discussed the limitations, risks, security and privacy concerns of performing an evaluation and management service by telephone and the availability of in person appointments. I also discussed with the patient that there may be a patient responsible charge related to this service. The patient expressed understanding and agreed to proceed.   History of Present Illness: The patient is complaining of sore throat, productive cough for greenish sputum, chest pain, fatigue and malaise that has been getting progressively worse since last Friday.  No known COVID-19 exposure.  No fever.  The patient works from home   Observations/Objective: The patient sounds nasal and tired on the phone  Assessment and Plan:  See plan Follow Up Instructions:    I discussed the assessment and treatment plan with the patient. The patient was provided an opportunity to ask questions and all were answered. The patient agreed with the plan and demonstrated an understanding of the instructions.   The patient was advised to call back or seek an in-person evaluation if the symptoms worsen or if the condition fails to improve as anticipated.  I provided 14 minutes of non-face-to-face time during this encounter.   Walker Kehr, MD

## 2020-12-26 NOTE — Telephone Encounter (Signed)
I received FMLA via fax.  Forms have been completed &Placed in providers box to review and sign.

## 2020-12-26 NOTE — Telephone Encounter (Signed)
She can go back to work on Monday if okay.  Thanks

## 2020-12-26 NOTE — Telephone Encounter (Signed)
Notified pt w/MD response. She states her employer states they are faxing over some forms for him to fill out. She ask if we received anything yet. Inform pt as of this am we have not, but once we receive he will fill out and fax back.Marland KitchenJohny [name]

## 2021-01-01 DIAGNOSIS — Z0279 Encounter for issue of other medical certificate: Secondary | ICD-10-CM

## 2021-01-01 NOTE — Telephone Encounter (Signed)
Forms have been signed, Faxed to [phone], Copy sent to scan &Charged for. Return to work date extend to 01/07/21.  Patient informed and original mailed to patient for her records.

## 2021-03-28 DIAGNOSIS — N760 Acute vaginitis: Secondary | ICD-10-CM | POA: Diagnosis not present

## 2021-03-28 DIAGNOSIS — Z01419 Encounter for gynecological examination (general) (routine) without abnormal findings: Secondary | ICD-10-CM | POA: Diagnosis not present

## 2021-03-28 DIAGNOSIS — Z1231 Encounter for screening mammogram for malignant neoplasm of breast: Secondary | ICD-10-CM | POA: Diagnosis not present

## 2021-03-28 DIAGNOSIS — Z6837 Body mass index (BMI) 37.0-37.9, adult: Secondary | ICD-10-CM | POA: Diagnosis not present

## 2021-05-16 ENCOUNTER — Telehealth: Payer: Self-pay | Admitting: Internal Medicine

## 2021-05-16 DIAGNOSIS — R350 Frequency of micturition: Secondary | ICD-10-CM

## 2021-05-16 DIAGNOSIS — R300 Dysuria: Secondary | ICD-10-CM

## 2021-05-16 NOTE — Telephone Encounter (Signed)
   Patient calling to report frequent, painful urination. Can urine test be ordered? No available appointments today

## 2021-05-17 DIAGNOSIS — N3010 Interstitial cystitis (chronic) without hematuria: Secondary | ICD-10-CM | POA: Diagnosis not present

## 2021-05-17 NOTE — Telephone Encounter (Signed)
LVM instructing pt that lab test will be ordered & to go to [facility] location for collection; advised that if needed to be seen by a provider, she should go to [facility].

## 2021-05-23 DIAGNOSIS — N3010 Interstitial cystitis (chronic) without hematuria: Secondary | ICD-10-CM | POA: Diagnosis not present

## 2021-12-15 ENCOUNTER — Telehealth: Payer: Self-pay | Admitting: Neurology

## 2021-12-15 NOTE — Telephone Encounter (Signed)
Received a message from the emergency on-call service, patient reporting worsening back pain worse when bending over.  Per chart review, she saw Dr. Leta Baptist back in October 2020. 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 showed.  I recommended patient to call the office tomorrow to making a follow-up appointment with Dr. Leta Baptist for reassessment and to go to the ED if her symptoms worsen to the point that she cannot ambulate or if she has bowel or bladder incontinence.  She is agreeable with plans.  Alric Ran, MD

## 2021-12-16 ENCOUNTER — Ambulatory Visit: Payer: 59 | Admitting: Diagnostic Neuroimaging

## 2021-12-16 NOTE — Telephone Encounter (Signed)
Pt cancelled appt due to transportation issues. Transferred pt to Billing.

## 2022-01-14 ENCOUNTER — Ambulatory Visit: Payer: BC Managed Care – PPO | Admitting: Diagnostic Neuroimaging

## 2022-01-15 ENCOUNTER — Encounter: Payer: Self-pay | Admitting: Diagnostic Neuroimaging

## 2022-01-19 ENCOUNTER — Other Ambulatory Visit: Payer: Self-pay | Admitting: Internal Medicine

## 2022-02-21 ENCOUNTER — Other Ambulatory Visit: Payer: Self-pay

## 2022-02-21 ENCOUNTER — Telehealth: Payer: BC Managed Care – PPO | Admitting: Nurse Practitioner

## 2022-02-21 ENCOUNTER — Ambulatory Visit
Admission: EM | Admit: 2022-02-21 | Discharge: 2022-02-21 | Disposition: A | Discharge status: Home / Self Care | Payer: BC Managed Care – PPO | Attending: Emergency Medicine | Admitting: Emergency Medicine

## 2022-02-21 DIAGNOSIS — J029 Acute pharyngitis, unspecified: Secondary | ICD-10-CM | POA: Diagnosis not present

## 2022-02-21 DIAGNOSIS — J302 Other seasonal allergic rhinitis: Secondary | ICD-10-CM | POA: Diagnosis not present

## 2022-02-21 LAB — POCT RAPID STREP A (OFFICE): Rapid Strep A Screen: NEGATIVE

## 2022-02-21 MED ORDER — CETIRIZINE HCL 10 MG PO TABS: 10.0000 mg | ORAL_TABLET | Freq: Every day | ORAL | 1 refills | Status: DC

## 2022-02-21 MED ORDER — OLOPATADINE HCL 0.1 % OP SOLN: 1.0000 [drp] | Freq: Two times a day (BID) | OPHTHALMIC | 2 refills | Status: DC

## 2022-02-21 MED ORDER — IBUPROFEN 400 MG PO TABS: 400.0000 mg | ORAL_TABLET | Freq: Three times a day (TID) | ORAL | 0 refills | Status: DC | PRN

## 2022-02-21 MED ORDER — PENICILLIN G BENZATHINE 1200000 UNIT/2ML IM SUSY
1.2000 10*6.[IU] | PREFILLED_SYRINGE | Freq: Once | INTRAMUSCULAR | Status: AC
  Administered 2022-02-21: 1.2 10*6.[IU] via INTRAMUSCULAR

## 2022-02-21 MED ORDER — FLUTICASONE PROPIONATE 50 MCG/ACT NA SUSP: 2.0000 | Freq: Every day | NASAL | 1 refills | Status: DC

## 2022-02-21 NOTE — ED Provider Notes (Signed)
[facility]    CSN: [id number] Arrival date & time: 02/21/22  1426    HISTORY   Chief Complaint  Patient presents with   Sore Throat   Nausea   HPI Meghan [name] is a 46 y.o. female. Patient reports a 6-day history of sore throat, nausea, vomiting and red, itchy eyes bilaterally.  Patient is afebrile on arrival with a heart rate of 119.  Rapid strep test today is negative.  Patient states that for the past week she has been fatigued, sleeping a lot.  Patient states she has been vomiting pretty much everything she eats, is not able to liquids down very well either.  She denies sick contacts at home, states she also works from home.  Patient denies known fever.  Patient endorses body ache, occasional chills and headache.  Patient reports a history of allergies and believes this is why her eyes are red and itchy.  The history is provided by the patient.  Past Medical History:  Diagnosis Date   Anxiety    B12 deficiency    Patient Active Problem List   Diagnosis Date Noted   Anxiety 12/05/2020   Fatigue 10/24/2020   Weight gain 10/24/2020   S/P TAH (total abdominal hysterectomy) 03/03/2016   Well adult exam 06/12/2015   Vitamin D deficiency 06/12/2015   Hypopigmentation 06/08/2015   Nausea without vomiting 06/08/2015   Dermatitis 08/23/2012   Pruritic dermatitis 08/23/2012   Abscess of lower back 06/02/2011   Lt low back pain 06/02/2011   CONSTIPATION, CHRONIC 03/01/2010   HEMATOCHEZIA 03/01/2010   Mood changes 01/11/2010   Acute upper respiratory infection 01/11/2010   VAGINITIS 12/14/2008   DYSURIA 12/14/2008   Past Surgical History:  Procedure Laterality Date   ABDOMINAL HYSTERECTOMY Bilateral 03/03/2016   Procedure: HYSTERECTOMY ABDOMINAL, left salpingectomy;  Surgeon: Marcelle Overlie, MD;  Location: [facility];  Service: Gynecology;  Laterality: Bilateral;   CESAREAN SECTION     x 2   DIAGNOSTIC LAPAROSCOPY     DILATION AND CURETTAGE OF UTERUS      DILITATION & CURRETTAGE/HYSTROSCOPY WITH HYDROTHERMAL ABLATION N/A 09/07/2015   Procedure: DILATATION & CURETTAGE/HYSTEROSCOPY WITH HYDROTHERMAL ABLATION;  Surgeon: Marcelle Overlie, MD;  Location: [facility];  Service: Gynecology;  Laterality: N/A;   INCISE AND DRAIN ABCESS     back   IUD REMOVAL N/A 09/07/2015   Procedure: INTRAUTERINE DEVICE (IUD) REMOVAL;  Surgeon: Marcelle Overlie, MD;  Location: [facility];  Service: Gynecology;  Laterality: N/A;   LAPAROSCOPIC TUBAL LIGATION Bilateral 09/07/2015   Procedure: LAPAROSCOPIC TUBAL LIGATION;  Surgeon: Marcelle Overlie, MD;  Location: [facility];  Service: Gynecology;  Laterality: Bilateral;   TUBAL LIGATION     OB History   No obstetric history on file.    Home Medications    Prior to Admission medications   Medication Sig Start Date End Date Taking? Authorizing Provider  Cholecalciferol (VITAMIN D3) 50 MCG (2000 UT) capsule TAKE 1 CAPSULE BY MOUTH EVERY DAY 01/20/22   Plotnikov, Georgina Quint, MD   Family History Family History  Problem Relation Age of Onset   Hypertension Father    Diabetes Maternal Grandmother    Heart attack Maternal Grandfather    Diabetes Paternal Grandmother    Social History Social History   Tobacco Use   Smoking status: Never   Smokeless tobacco: Never  Substance Use Topics   Alcohol use: No   Drug use: No   Allergies   Patient has no known allergies.  Review of  Systems Review of Systems Pertinent findings noted in history of present illness.   Physical Exam Triage Vital Signs ED Triage Vitals  Enc Vitals Group     BP 10/04/21 0827 (!) 147/82     Pulse Rate 10/04/21 0827 72     Resp 10/04/21 0827 18     Temp 10/04/21 0827 98.3 F (36.8 C)     Temp Source 10/04/21 0827 Oral     SpO2 10/04/21 0827 98 %     Weight --      Height --      Head Circumference --      Peak Flow --      Pain Score 10/04/21 0826 5     Pain Loc --      Pain Edu? --      Excl. in GC? --   No data found.  Updated Vital  Signs BP 118/79 (BP Location: Left Arm)   Pulse (!) 119   Temp 98.4 F (36.9 C) (Oral)   Resp 20   LMP 01/28/2016   SpO2 95%   Physical Exam Constitutional:      General: She is not in acute distress.    Appearance: She is well-developed. She is obese. She is ill-appearing. She is not toxic-appearing.  HENT:     Head: Normocephalic and atraumatic.     Salivary Glands: Right salivary gland is diffusely enlarged and tender. Left salivary gland is diffusely enlarged and tender.     Right Ear: Hearing and external ear normal. A middle ear effusion is present. Tympanic membrane is bulging. Tympanic membrane is not injected or erythematous.     Left Ear: Hearing and external ear normal. A middle ear effusion is present. Tympanic membrane is bulging. Tympanic membrane is not injected or erythematous.     Ears:     Comments: Bilateral EACs with mild erythema, both TMs bulging with clear fluid    Nose: Rhinorrhea present. No nasal deformity, septal deviation, signs of injury, nasal tenderness, mucosal edema or congestion. Rhinorrhea is clear.     Right Nostril: Occlusion present. No foreign body, epistaxis or septal hematoma.     Left Nostril: Occlusion present. No foreign body, epistaxis or septal hematoma.     Right Turbinates: Enlarged, swollen and pale.     Left Turbinates: Enlarged, swollen and pale.     Right Sinus: No maxillary sinus tenderness or frontal sinus tenderness.     Left Sinus: No maxillary sinus tenderness or frontal sinus tenderness.     Mouth/Throat:     Lips: Pink. No lesions.     Mouth: Mucous membranes are moist. No oral lesions or angioedema.     Dentition: No gingival swelling.     Tongue: No lesions.     Palate: No mass.     Pharynx: Uvula midline. Pharyngeal swelling, oropharyngeal exudate and posterior oropharyngeal erythema present. No uvula swelling.     Tonsils: Tonsillar exudate present. 2+ on the right. 2+ on the left.     Comments: Postnasal drip Eyes:      General: Lids are normal. Lids are everted, no foreign bodies appreciated. Vision grossly intact.        Right eye: No foreign body or discharge.        Left eye: No foreign body or discharge.     Extraocular Movements: Extraocular movements intact.     Conjunctiva/sclera:     Right eye: Right conjunctiva is injected. No exudate or hemorrhage.    Left  eye: Left conjunctiva is injected. No exudate or hemorrhage.    Pupils: Pupils are equal, round, and reactive to light.     Slit lamp exam:    Right eye: Anterior chamber quiet.     Left eye: Anterior chamber quiet.  Neck:     Thyroid: No thyroid mass, thyromegaly or thyroid tenderness.     Trachea: Tracheal tenderness present. No abnormal tracheal secretions or tracheal deviation.     Comments: Voice is muffled Cardiovascular:     Rate and Rhythm: Regular rhythm. Tachycardia present.     Pulses: Normal pulses.     Heart sounds: Normal heart sounds, S1 normal and S2 normal. No murmur heard.   No friction rub. No gallop.  Pulmonary:     Effort: Pulmonary effort is normal. No tachypnea, bradypnea, accessory muscle usage, prolonged expiration, respiratory distress or retractions.     Breath sounds: Normal breath sounds and air entry. No stridor, decreased air movement or transmitted upper airway sounds. No decreased breath sounds, wheezing, rhonchi or rales.  Abdominal:     General: Bowel sounds are normal.     Palpations: Abdomen is soft.     Tenderness: There is generalized abdominal tenderness. There is no right CVA tenderness, left CVA tenderness or rebound. Negative signs include Murphy's sign.     Hernia: No hernia is present.  Musculoskeletal:        General: No tenderness. Normal range of motion.     Cervical back: Full passive range of motion without pain, normal range of motion and neck supple.     Right lower leg: No edema.     Left lower leg: No edema.  Lymphadenopathy:     Cervical: Cervical adenopathy present.     Right  cervical: Superficial cervical adenopathy and posterior cervical adenopathy present.     Left cervical: Superficial cervical adenopathy and posterior cervical adenopathy present.  Skin:    General: Skin is warm and dry.     Findings: No erythema, lesion or rash.  Neurological:     General: No focal deficit present.     Mental Status: She is alert and oriented to person, place, and time. Mental status is at baseline.  Psychiatric:        Mood and Affect: Mood normal.        Behavior: Behavior normal.        Thought Content: Thought content normal.        Judgment: Judgment normal.    Visual Acuity Right Eye Distance:   Left Eye Distance:   Bilateral Distance:    Right Eye Near:   Left Eye Near:    Bilateral Near:     [facility] Couse / Diagnostics / Procedures:    EKG  Radiology No results found.  Procedures Procedures (including critical care time)  UC Diagnoses / Final Clinical Impressions(s)   I have reviewed the triage vital signs and the nursing notes.  Pertinent labs & imaging results that were available during my care of the patient were reviewed by me and considered in my medical decision making (see chart for details).   Final diagnoses:  Acute pharyngitis, unspecified etiology  Seasonal allergic rhinitis, unspecified trigger   Patient clearly suffers from allergic rhinitis but also has significant enlargement and erythema both of her tonsils with mild exudate.  I believe the rapid strep test today is a false negative and the patient would benefit from empiric treatment with antibiotics.  Throat culture be performed per protocol.  Because patient has been sick for over a week and has been unable to work, patient was provided with an injection of Bicillin during her visit today.  Patient has been advised to return in 2 to 3 days if she is not feeling any better.  ED Prescriptions     Medication Sig Dispense Auth. Provider   fluticasone (FLONASE) 50 MCG/ACT nasal spray  Place 2 sprays into both nostrils daily. 48 mL Theadora Rama Scales, PA-C   olopatadine (PATADAY) 0.1 % ophthalmic solution Place 1 drop into both eyes 2 (two) times daily. 5 mL Theadora Rama Scales, PA-C   cetirizine (ZYRTEC ALLERGY) 10 MG tablet Take 1 tablet (10 mg total) by mouth at bedtime. 90 tablet Theadora Rama Scales, PA-C   ibuprofen (ADVIL) 400 MG tablet Take 1 tablet (400 mg total) by mouth every 8 (eight) hours as needed for up to 30 doses. 30 tablet Theadora Rama Scales, PA-C      PDMP not reviewed this encounter.  Pending results:  Labs Reviewed  CULTURE, GROUP A STREP [facility])  POCT RAPID STREP A (OFFICE)    Medications Ordered in UC: Medications  penicillin g benzathine (BICILLIN LA) 1200000 UNIT/2ML injection 1.2 Million Units (has no administration in time range)    Disposition Upon Discharge:  Condition: stable for discharge home Home: take medications as prescribed; routine discharge instructions as discussed; follow up as advised.  Patient presented with an acute illness with associated systemic symptoms and significant discomfort requiring urgent management. In my opinion, this is a condition that a prudent lay person (someone who possesses an average knowledge of health and medicine) may potentially expect to result in complications if not addressed urgently such as respiratory distress, impairment of bodily function or dysfunction of bodily organs.   Routine symptom specific, illness specific and/or disease specific instructions were discussed with the patient and/or caregiver at length.   As such, the patient has been evaluated and assessed, work-up was performed and treatment was provided in alignment with urgent care protocols and evidence based medicine.  Patient/parent/caregiver has been advised that the patient may require follow up for further testing and treatment if the symptoms continue in spite of treatment, as clinically indicated and  appropriate.  If the patient was tested for COVID-19, Influenza and/or RSV, then the patient/parent/guardian was advised to isolate at home pending the results of his/her diagnostic coronavirus test and potentially longer if theyre positive. I have also advised pt that if his/her COVID-19 test returns positive, it's recommended to self-isolate for at least 10 days after symptoms first appeared AND until fever-free for 24 hours without fever reducer AND other symptoms have improved or resolved. Discussed self-isolation recommendations as well as instructions for household member/close contacts as per the [facility] and [place] DHHS, and also gave patient the COVID packet with this information.  Patient/parent/caregiver has been advised to return to the [facility] or PCP in 3-5 days if no better; to PCP or the Emergency Department if new signs and symptoms develop, or if the current signs or symptoms continue to change or worsen for further workup, evaluation and treatment as clinically indicated and appropriate  The patient will follow up with their current PCP if and as advised. If the patient does not currently have a PCP we will assist them in obtaining one.   The patient may need specialty follow up if the symptoms continue, in spite of conservative treatment and management, for further workup, evaluation, consultation and treatment as clinically indicated and appropriate.  Patient/parent/caregiver verbalized understanding and agreement of plan as discussed.  All questions were addressed during visit.  Please see discharge instructions below for further details of plan.  Discharge Instructions:   Discharge Instructions      Your strep test today is negative.  Throat culture will be performed per our protocol.  The result of your throat culture will be posted to your MyChart once it is complete, this typically takes 3 to 5 days.  If there is a positive result, you will be contacted by phone and antibiotics will be  prescribed for you.  Based on the excellent history that you provided to me today, your current symptoms and my physical exam findings, I believe that you are suffering from a bacterial throat infection that will respond to antibiotics.  To treat this, you were provided with an injection of Bicillin in the office today.  I anticipate that by Sunday or Monday you will be feeling much better.  If you are not, please feel free to return for repeat evaluation and further treatment.  For your seasonal allergies, I have provided you with prescriptions for Zyrtec, Flonase and Pataday eyedrops.  Please use these as prescribed and I recommend that you continue them through June for best results.  Thank you for visiting urgent care today.  We appreciate the opportunity to participate in your care.       This office note has been dictated using Teaching laboratory technician.  Unfortunately, and despite my best efforts, this method of dictation can sometimes lead to occasional typographical or grammatical errors.  I apologize in advance if this occurs.     Theadora Rama Scales, [place] 02/21/22 [phone]

## 2022-02-21 NOTE — ED Triage Notes (Signed)
Pt c/o sore throat, nausea, vomiting and redness/ itching to bilateral eyes.  ?Started: Sunday ?

## 2022-02-21 NOTE — Discharge Instructions (Addendum)
Your strep test today is negative.  Throat culture will be performed per our protocol.  The result of your throat culture will be posted to your MyChart once it is complete, this typically takes 3 to 5 days.  If there is a positive result, you will be contacted by phone and antibiotics will be prescribed for you. ? ?Based on the excellent history that you provided to me today, your current symptoms and my physical exam findings, I believe that you are suffering from a bacterial throat infection that will respond to antibiotics.  To treat this, you were provided with an injection of Bicillin in the office today.  I anticipate that by Sunday or Monday you will be feeling much better.  If you are not, please feel free to return for repeat evaluation and further treatment. ? ?For your seasonal allergies, I have provided you with prescriptions for Zyrtec, Flonase and Pataday eyedrops.  Please use these as prescribed and I recommend that you continue them through June for best results. ? ?Thank you for visiting urgent care today.  We appreciate the opportunity to participate in your care. ?  ? ?

## 2022-02-24 ENCOUNTER — Telehealth (HOSPITAL_COMMUNITY): Payer: Self-pay | Admitting: Emergency Medicine

## 2022-02-24 LAB — CULTURE, GROUP A STREP (THRC)

## 2022-02-24 MED ORDER — AMOXICILLIN 500 MG PO CAPS: 500.0000 mg | ORAL_CAPSULE | Freq: Two times a day (BID) | ORAL | 0 refills | Status: AC

## 2023-01-28 ENCOUNTER — Ambulatory Visit (INDEPENDENT_AMBULATORY_CARE_PROVIDER_SITE_OTHER): Payer: Commercial Managed Care - HMO | Admitting: Family Medicine

## 2023-01-28 ENCOUNTER — Encounter: Payer: Self-pay | Admitting: Family Medicine

## 2023-01-28 VITALS — BP 128/80 | HR 70 | Temp 98.7°F | Resp 12 | Ht 63.0 in | Wt 205.0 lb

## 2023-01-28 DIAGNOSIS — A084 Viral intestinal infection, unspecified: Secondary | ICD-10-CM

## 2023-01-28 DIAGNOSIS — R112 Nausea with vomiting, unspecified: Secondary | ICD-10-CM | POA: Diagnosis not present

## 2023-01-28 LAB — POC COVID19 BINAXNOW: SARS Coronavirus 2 Ag: NEGATIVE

## 2023-01-28 MED ORDER — ONDANSETRON 4 MG PO TBDP: 4.0000 mg | ORAL_TABLET | Freq: Three times a day (TID) | ORAL | 0 refills | Status: AC | PRN

## 2023-01-28 NOTE — Patient Instructions (Addendum)
A few things to remember from today's visit:  Viral gastroenteritis - Plan: POC COVID-19  Nausea and vomiting in adult - Plan: ondansetron (ZOFRAN-ODT) 4 MG disintegrating tablet, POC COVID-19  Adequate hydration. Advance diet as tolerated. Follow up with Dr Alain Marion if your are not feeling any better in 4-5 days.  If you need refills for medications you take chronically, please call your pharmacy. Do not use My Chart to request refills or for acute issues that need immediate attention. If you send a my chart message, it may take a few days to be addressed, specially if I am not in the office.  Please be sure medication list is accurate. If a new problem present, please set up appointment sooner than planned today.

## 2023-01-28 NOTE — Progress Notes (Signed)
ACUTE VISIT Chief Complaint  Patient presents with   Diarrhea        Vomiting   HPI: MeghanMeghan [name] is a 47 y.o. female with PMHx significant for IC, constipation, vitamin D deficiency, and anxiety here today complaining of diarrhea, nausea, and vomiting since Sunday.   She experienced a low-grade fever of 100.1 F on Sunday and Monday, accompanied by chills. She also has a sore throat and shortness of breath when going up and down the stairs, which started on Monday.   Diarrhea  This is a new problem. The current episode started in the past 7 days. The problem occurs 5 to 10 times per day. The problem has been gradually improving. The patient states that diarrhea does not awaken her from sleep. Associated symptoms include abdominal pain, chills, a fever and a URI. Pertinent negatives include no arthralgias, bloating, coughing, headaches, increased  flatus, myalgias or vomiting. Risk factors include ill contacts. She has tried anti-motility drug for the symptoms. The treatment provided mild relief. There is no history of bowel resection, inflammatory bowel disease or irritable bowel syndrome.  She is experiencing abdominal pain, described as cramps, with a severity of 8 out of 10, periumbilical, and intermittent. Symptoms are exacerbated by oral intake. Abdominal pain does not improve with defecation. She has not noted mucus or blood in the stool. She has had four stools today and more frequent visits to the bathroom every two to three hours yesterday.  1 episode of vomiting today and 3-4 yesterday. Negative for hematemesis.  Negative for any recent suspicious food intake or travel.  She has not taken a COVID test at home. She has been taking Tylenol and Imodium for symptom management.  She reports that her daughter was sick with similar symptoms two weeks ago, but her illness only lasted for two days.   Review of Systems  Constitutional:  Positive for chills, fatigue and fever.   HENT:  Negative for congestion, postnasal drip and rhinorrhea.   Respiratory:  Negative for cough, shortness of breath and wheezing.   Cardiovascular:  Negative for chest pain and palpitations.  Gastrointestinal:  Positive for abdominal pain and diarrhea. Negative for bloating, flatus and vomiting.  Endocrine: Negative for cold intolerance and heat intolerance.  Genitourinary:  Negative for decreased urine volume, dysuria and hematuria.  Musculoskeletal:  Negative for arthralgias and myalgias.  Skin:  Negative for rash.  Neurological:  Negative for headaches.  Hematological:  Negative for adenopathy. Does not bruise/bleed easily.  See other pertinent positives and negatives in HPI.  Current Outpatient Medications on File Prior to Visit  Medication Sig Dispense Refill   alprazolam (XANAX) 2 MG tablet Take 2 mg by mouth 3 (three) times daily.     Cholecalciferol (VITAMIN D3) 50 MCG (2000 UT) capsule TAKE 1 CAPSULE BY MOUTH EVERY DAY 100 capsule 3   fluticasone (FLONASE) 50 MCG/ACT nasal spray Place 2 sprays into both nostrils daily. 48 mL 1   ibuprofen (ADVIL) 400 MG tablet Take 1 tablet (400 mg total) by mouth every 8 (eight) hours as needed for up to 30 doses. 30 tablet 0   olopatadine (PATADAY) 0.1 % ophthalmic solution Place 1 drop into both eyes 2 (two) times daily. 5 mL 2   cetirizine (ZYRTEC ALLERGY) 10 MG tablet Take 1 tablet (10 mg total) by mouth at bedtime. 90 tablet 1   No current facility-administered medications on file prior to visit.   Past Medical History:  Diagnosis Date   Anxiety  B12 deficiency    No Known Allergies  Social History   Socioeconomic History   Marital status: Single    Spouse name: Not on file   Number of children: 2   Years of education: Not on file   Highest education level: Associate degree: academic program  Occupational History    Comment: 09/2019 [employer]  Tobacco Use   Smoking status: Never   Smokeless tobacco: Never  Substance and  Sexual Activity   Alcohol use: No   Drug use: No   Sexual activity: Never  Other Topics Concern   Not on file  Social History Narrative   Lives with one child   Caffeine- none   Social Determinants of Health   Financial Resource Strain: Not on file  Food Insecurity: Not on file  Transportation Needs: Not on file  Physical Activity: Not on file  Stress: Not on file  Social Connections: Not on file   Vitals:   01/28/23 1200  BP: 128/80  Pulse: 70  Resp: 12  Temp: 98.7 F (37.1 C)  SpO2: 98%   Body mass index is 36.31 kg/m.  Physical Exam Vitals and nursing note reviewed.  Constitutional:      General: She is not in acute distress.    Appearance: She is well-developed.  HENT:     Head: Normocephalic and atraumatic.     Mouth/Throat:     Mouth: Mucous membranes are moist.     Pharynx: Oropharynx is clear.  Eyes:     Conjunctiva/sclera: Conjunctivae normal.  Cardiovascular:     Rate and Rhythm: Normal rate and regular rhythm.     Heart sounds: No murmur heard. Pulmonary:     Effort: Pulmonary effort is normal. No respiratory distress.     Breath sounds: Normal breath sounds.  Abdominal:     General: Bowel sounds are normal.     Palpations: Abdomen is soft. There is no hepatomegaly or mass.     Tenderness: There is abdominal tenderness in the periumbilical area. There is no guarding or rebound.  Lymphadenopathy:     Cervical: No cervical adenopathy.  Skin:    General: Skin is warm.     Findings: No erythema or rash.  Neurological:     General: No focal deficit present.     Mental Status: She is alert and oriented to person, place, and time.     Cranial Nerves: No cranial nerve deficit.     Gait: Gait normal.  Psychiatric:        Mood and Affect: Mood and affect normal.   ASSESSMENT AND PLAN: Meghan [name] is a 47 year old female seen today for diarrhea, nausea, vomiting.  Viral gastroenteritis Most likely viral etiology, so symptomatic treatment  recommended for now. Other possible causes discussed but at this time I do not think further blood work-up is necessary. COVID-19 test done in the office was negative. OTC Imodium could be used but I do not recommend unless 6 or more stools daily. Oral hydration, she got Gatorade at home, so can dilute it 1:1 with water. Pedialyte is also good options. Bland and light diet if tolerated. Clearly instructed about warning signs. F/U with PCP if symptoms have not any better in 4 to 5 days, before if they get worse.  -     POC COVID-19 BinaxNow  Nausea and vomiting in adult Adequate hydration, small sips frequently throughout the day. Zofran 4 mg 3 times daily as needed. Clearly instructed about warning signs.  She voices  understanding and agrees with plan. Note for work given.  -     Ondansetron; Take 1 tablet (4 mg total) by mouth every 8 (eight) hours as needed for up to 4 days for nausea or vomiting.  Dispense: 12 tablet; Refill: 0 -     POC COVID-19 BinaxNow  Return if symptoms worsen or fail to improve.  Milda Lindvall G. [place], MD  [facility]. [place] office.

## 2023-02-19 ENCOUNTER — Ambulatory Visit (INDEPENDENT_AMBULATORY_CARE_PROVIDER_SITE_OTHER): Payer: Commercial Managed Care - HMO | Admitting: Internal Medicine

## 2023-02-19 ENCOUNTER — Encounter: Payer: Self-pay | Admitting: Internal Medicine

## 2023-02-19 VITALS — BP 112/82 | HR 80 | Temp 98.0°F | Ht 63.0 in | Wt 207.0 lb

## 2023-02-19 DIAGNOSIS — J069 Acute upper respiratory infection, unspecified: Secondary | ICD-10-CM

## 2023-02-19 LAB — POC INFLUENZA A&B (BINAX/QUICKVUE)
Influenza A, POC: NEGATIVE
Influenza B, POC: NEGATIVE

## 2023-02-19 LAB — POC COVID19 BINAXNOW: SARS Coronavirus 2 Ag: NEGATIVE

## 2023-02-19 LAB — POCT RESPIRATORY SYNCYTIAL VIRUS: RSV Rapid Ag: NEGATIVE

## 2023-02-19 MED ORDER — AZITHROMYCIN 250 MG PO TABS: ORAL_TABLET | ORAL | 0 refills | Status: DC

## 2023-02-19 MED ORDER — HYDROCODONE BIT-HOMATROP MBR 5-1.5 MG/5ML PO SOLN: 5.0000 mL | Freq: Three times a day (TID) | ORAL | 0 refills | Status: DC | PRN

## 2023-02-19 NOTE — Addendum Note (Signed)
Addended by: Marcina Millard on: 02/19/2023 12:19 PM   Modules accepted: Orders

## 2023-02-19 NOTE — Progress Notes (Signed)
Subjective:    Patient ID: Meghan [name], female    DOB: Apr 26, 1976, 47 y.o.   MRN: [id number]      HPI Meghan [name] is here for  Chief Complaint  Patient presents with   Cough    Coughing, chest pain from coughing (non-productive),chills; Sunday symptoms started; Still nausea    She is here for an acute visit for cold symptoms.   Her symptoms started 4 days ago, today is day 5 of symptoms  She is experiencing fever, chills, ear pain, runny nose, sore throat, cough, SOB, nausea, body aches, headaches, dizziness/lightheadedness.  Cough is getting worse - it is hard for her to sleep.   She has tried taking Tylenol, delsym     Medications and allergies reviewed with patient and updated if appropriate.  Current Outpatient Medications on File Prior to Visit  Medication Sig Dispense Refill   alprazolam (XANAX) 2 MG tablet Take 2 mg by mouth 3 (three) times daily.     Cholecalciferol (VITAMIN D3) 50 MCG (2000 UT) capsule TAKE 1 CAPSULE BY MOUTH EVERY DAY 100 capsule 3   fluticasone (FLONASE) 50 MCG/ACT nasal spray Place 2 sprays into both nostrils daily. 48 mL 1   ibuprofen (ADVIL) 400 MG tablet Take 1 tablet (400 mg total) by mouth every 8 (eight) hours as needed for up to 30 doses. 30 tablet 0   olopatadine (PATADAY) 0.1 % ophthalmic solution Place 1 drop into both eyes 2 (two) times daily. 5 mL 2   cetirizine (ZYRTEC ALLERGY) 10 MG tablet Take 1 tablet (10 mg total) by mouth at bedtime. 90 tablet 1   No current facility-administered medications on file prior to visit.    Review of Systems  Constitutional:  Positive for chills and fever (102.7 last night).  HENT:  Positive for ear pain (a little), rhinorrhea and sore throat. Negative for congestion, sinus pressure and sinus pain.   Respiratory:  Positive for cough (dry, but can hear mucus in chest) and shortness of breath. Negative for wheezing.   Gastrointestinal:  Positive for constipation and nausea. Negative for diarrhea.   Musculoskeletal:  Positive for myalgias.  Neurological:  Positive for dizziness, light-headedness and headaches.       Objective:   Vitals:   02/19/23 0834  BP: 112/82  Pulse: 80  Temp: 98 F (36.7 C)  SpO2: 99%   BP Readings from Last 3 Encounters:  02/19/23 112/82  01/28/23 128/80  02/21/22 118/79   Wt Readings from Last 3 Encounters:  02/19/23 207 lb (93.9 kg)  01/28/23 205 lb (93 kg)  12/05/20 222 lb (100.7 kg)   Body mass index is 36.67 kg/m.    Physical Exam Constitutional:      General: She is not in acute distress.    Appearance: Normal appearance. She is not ill-appearing.  HENT:     Head: Normocephalic and atraumatic.     Right Ear: Tympanic membrane, ear canal and external ear normal.     Left Ear: Tympanic membrane, ear canal and external ear normal.     Mouth/Throat:     Mouth: Mucous membranes are moist.     Pharynx: No oropharyngeal exudate or posterior oropharyngeal erythema.  Eyes:     Conjunctiva/sclera: Conjunctivae normal.  Cardiovascular:     Rate and Rhythm: Normal rate and regular rhythm.  Pulmonary:     Effort: Pulmonary effort is normal. No respiratory distress.     Breath sounds: Normal breath sounds. No wheezing or rales.  Musculoskeletal:     Cervical back: Neck supple. No tenderness.  Lymphadenopathy:     Cervical: No cervical adenopathy.  Skin:    General: Skin is warm and dry.  Neurological:     Mental Status: She is alert.            Assessment & Plan:    Acute upper respiratory infection: Acute Rsv, covid, flu negative Concern for bacterial cause Start zpak, hycodan otc cold medications Rest, fluid Call if no improvement

## 2023-02-19 NOTE — Patient Instructions (Addendum)
     Your test for covid  was negative.  Your test for flu was negative.  Your test for rsv was negative.      Medications changes include :   zpak, hycodan cough syrup     Return if symptoms worsen or fail to improve.

## 2023-08-04 ENCOUNTER — Telehealth: Payer: Self-pay | Admitting: Internal Medicine

## 2023-08-04 NOTE — Telephone Encounter (Signed)
Error message

## 2023-08-11 ENCOUNTER — Ambulatory Visit: Payer: Commercial Managed Care - HMO | Admitting: Internal Medicine

## 2023-08-11 ENCOUNTER — Ambulatory Visit (INDEPENDENT_AMBULATORY_CARE_PROVIDER_SITE_OTHER): Payer: 59 | Admitting: Family

## 2023-08-11 ENCOUNTER — Encounter: Payer: Self-pay | Admitting: Family

## 2023-08-11 VITALS — BP 124/78 | HR 59 | Temp 97.7°F | Ht 63.0 in | Wt 201.0 lb

## 2023-08-11 DIAGNOSIS — N3090 Cystitis, unspecified without hematuria: Secondary | ICD-10-CM | POA: Diagnosis not present

## 2023-08-11 LAB — POCT URINALYSIS DIPSTICK
Bilirubin, UA: NEGATIVE
Blood, UA: NEGATIVE
Glucose, UA: NEGATIVE
Nitrite, UA: POSITIVE
Protein, UA: NEGATIVE
Spec Grav, UA: 1.02 (ref 1.010–1.025)
Urobilinogen, UA: 0.2 U/dL
pH, UA: 6 (ref 5.0–8.0)

## 2023-08-11 MED ORDER — SULFAMETHOXAZOLE-TRIMETHOPRIM 800-160 MG PO TABS: 1.0000 | ORAL_TABLET | Freq: Two times a day (BID) | ORAL | 0 refills | Status: DC

## 2023-08-11 NOTE — Progress Notes (Signed)
Patient ID: Meghan [name], female    DOB: 1976-09-07, 47 y.o.   MRN: [id number]  Chief Complaint  Patient presents with   Urinary Concern    Low back pain and frequent urination x 1 month    HPI:      Urinary sx:  pt reports low back pain, foul urine odor and urinary frequency for about a month. Denies any dysuria, pelvic pain, nausea, fever, or vaginal sx. Followed by urology d/t past frequent UTIs, but denies having any this year.      Assessment & Plan:  1. Cystitis mild leuks, d/t pt hx of frequent UTIs, sending Bactrim, advised on use & SE, sending urine out for culture, advised on increased water intake.  - POCT Urinalysis Dipstick - Urine Culture - sulfamethoxazole-trimethoprim (BACTRIM DS) 800-160 MG tablet; Take 1 tablet by mouth 2 (two) times daily after a meal.  Dispense: 6 tablet; Refill: 0  Subjective:    Outpatient Medications Prior to Visit  Medication Sig Dispense Refill   alprazolam (XANAX) 2 MG tablet Take 2 mg by mouth 3 (three) times daily.     Cholecalciferol (VITAMIN D3) 50 MCG (2000 UT) capsule TAKE 1 CAPSULE BY MOUTH EVERY DAY 100 capsule 3   zolpidem (AMBIEN) 10 MG tablet Take 10 mg by mouth at bedtime.     ibuprofen (ADVIL) 400 MG tablet Take 1 tablet (400 mg total) by mouth every 8 (eight) hours as needed for up to 30 doses. (Patient not taking: Reported on 08/11/2023) 30 tablet 0   azithromycin (ZITHROMAX) 250 MG tablet Take two tabs the first day and then one tab daily for four days (Patient not taking: Reported on 08/11/2023) 6 tablet 0   cetirizine (ZYRTEC ALLERGY) 10 MG tablet Take 1 tablet (10 mg total) by mouth at bedtime. (Patient not taking: Reported on 08/11/2023) 90 tablet 1   fluticasone (FLONASE) 50 MCG/ACT nasal spray Place 2 sprays into both nostrils daily. (Patient not taking: Reported on 08/11/2023) 48 mL 1   HYDROcodone bit-homatropine (HYCODAN) 5-1.5 MG/5ML syrup Take 5 mLs by mouth every 8 (eight) hours as needed for cough. (Patient not taking:  Reported on 08/11/2023) 120 mL 0   olopatadine (PATADAY) 0.1 % ophthalmic solution Place 1 drop into both eyes 2 (two) times daily. (Patient not taking: Reported on 08/11/2023) 5 mL 2   No facility-administered medications prior to visit.   Past Medical History:  Diagnosis Date   Anxiety    B12 deficiency    Past Surgical History:  Procedure Laterality Date   ABDOMINAL HYSTERECTOMY Bilateral 03/03/2016   Procedure: HYSTERECTOMY ABDOMINAL, left salpingectomy;  Surgeon: Marcelle Overlie, MD;  Location: [facility];  Service: Gynecology;  Laterality: Bilateral;   CESAREAN SECTION     x 2   DIAGNOSTIC LAPAROSCOPY     DILATION AND CURETTAGE OF UTERUS     DILITATION & CURRETTAGE/HYSTROSCOPY WITH HYDROTHERMAL ABLATION N/A 09/07/2015   Procedure: DILATATION & CURETTAGE/HYSTEROSCOPY WITH HYDROTHERMAL ABLATION;  Surgeon: Marcelle Overlie, MD;  Location: [facility];  Service: Gynecology;  Laterality: N/A;   INCISE AND DRAIN ABCESS     back   IUD REMOVAL N/A 09/07/2015   Procedure: INTRAUTERINE DEVICE (IUD) REMOVAL;  Surgeon: Marcelle Overlie, MD;  Location: [facility];  Service: Gynecology;  Laterality: N/A;   LAPAROSCOPIC TUBAL LIGATION Bilateral 09/07/2015   Procedure: LAPAROSCOPIC TUBAL LIGATION;  Surgeon: Marcelle Overlie, MD;  Location: [facility];  Service: Gynecology;  Laterality: Bilateral;   TUBAL LIGATION     No  Known Allergies    Objective:    Physical Exam Vitals and nursing note reviewed.  Constitutional:      Appearance: Normal appearance.  Cardiovascular:     Rate and Rhythm: Normal rate and regular rhythm.  Pulmonary:     Effort: Pulmonary effort is normal.     Breath sounds: Normal breath sounds.  Musculoskeletal:        General: Normal range of motion.  Skin:    General: Skin is warm and dry.  Neurological:     Mental Status: She is alert.  Psychiatric:        Mood and Affect: Mood normal.        Behavior: Behavior normal.    BP 124/78   Pulse (!) 59   Temp 97.7 F (36.5 C)   Ht 5'  3" (1.6 m)   Wt 201 lb (91.2 kg)   LMP 01/28/2016   SpO2 99%   BMI 35.61 kg/m  Wt Readings from Last 3 Encounters:  08/11/23 201 lb (91.2 kg)  02/19/23 207 lb (93.9 kg)  01/28/23 205 lb (93 kg)      Dulce Sellar, NP

## 2023-08-13 LAB — URINE CULTURE
MICRO NUMBER:: 15413417
SPECIMEN QUALITY:: ADEQUATE

## 2023-08-16 NOTE — Progress Notes (Signed)
urine culture positive, Bactrim should treat her UTI, thx

## 2023-08-24 ENCOUNTER — Telehealth: Payer: Self-pay | Admitting: Internal Medicine

## 2023-08-24 NOTE — Telephone Encounter (Signed)
Pt called still having issue with an UTI in her lower back is still hurting and having frequent urination pt was seen September 3rd at [facility]  Dr. Only gave 3 antibiotic pills. Pt need more medication to cure her UTI fully. Please advise.

## 2023-08-25 NOTE — Telephone Encounter (Signed)
Pt called still having issue with an UTI in her lower back is still hurting and having frequent urination pt was seen September 3rd at [facility]  Dr. Only gave 3 antibiotic pills. Pt need more medication to cure her UTI fully. Please advise.

## 2023-08-26 NOTE — Telephone Encounter (Signed)
Pt called back stating she is getting worse.

## 2023-08-28 MED ORDER — AMOXICILLIN 500 MG PO CAPS: 500.0000 mg | ORAL_CAPSULE | Freq: Three times a day (TID) | ORAL | 0 refills | Status: DC

## 2023-08-28 NOTE — Telephone Encounter (Signed)
I will send a prescription for a different antibiotic. Please schedule a well visit with me soon.  She did not see me since 2021.  Thank you

## 2023-08-28 NOTE — Addendum Note (Signed)
Addended by: Tresa Garter on: 08/28/2023 03:59 PM   Modules accepted: Orders

## 2023-08-31 NOTE — Telephone Encounter (Signed)
I was able to speak with the pt and inform her of Dr. Suszanne Conners advice.  Pt states she will go to pick up meds today and would like to be seen by provider soon then his next available which is 09/15/2023.

## 2023-10-30 ENCOUNTER — Other Ambulatory Visit: Payer: Self-pay

## 2023-10-30 ENCOUNTER — Encounter (HOSPITAL_COMMUNITY): Payer: Self-pay | Admitting: Emergency Medicine

## 2023-10-30 ENCOUNTER — Emergency Department (HOSPITAL_COMMUNITY): Payer: 59

## 2023-10-30 ENCOUNTER — Observation Stay (HOSPITAL_COMMUNITY)
Admission: EM | Admit: 2023-10-30 | Discharge: 2023-10-31 | Disposition: A | Discharge status: Home / Self Care | Payer: 59 | Attending: Family Medicine | Admitting: Family Medicine

## 2023-10-30 DIAGNOSIS — R569 Unspecified convulsions: Secondary | ICD-10-CM

## 2023-10-30 DIAGNOSIS — M545 Low back pain, unspecified: Secondary | ICD-10-CM | POA: Diagnosis not present

## 2023-10-30 DIAGNOSIS — R739 Hyperglycemia, unspecified: Secondary | ICD-10-CM | POA: Insufficient documentation

## 2023-10-30 DIAGNOSIS — F32A Depression, unspecified: Secondary | ICD-10-CM | POA: Insufficient documentation

## 2023-10-30 DIAGNOSIS — S22089A Unspecified fracture of T11-T12 vertebra, initial encounter for closed fracture: Secondary | ICD-10-CM | POA: Diagnosis not present

## 2023-10-30 DIAGNOSIS — G40909 Epilepsy, unspecified, not intractable, without status epilepticus: Principal | ICD-10-CM | POA: Insufficient documentation

## 2023-10-30 DIAGNOSIS — S22040A Wedge compression fracture of fourth thoracic vertebra, initial encounter for closed fracture: Secondary | ICD-10-CM | POA: Insufficient documentation

## 2023-10-30 DIAGNOSIS — Z79899 Other long term (current) drug therapy: Secondary | ICD-10-CM | POA: Diagnosis not present

## 2023-10-30 DIAGNOSIS — S22000A Wedge compression fracture of unspecified thoracic vertebra, initial encounter for closed fracture: Secondary | ICD-10-CM

## 2023-10-30 DIAGNOSIS — G5711 Meralgia paresthetica, right lower limb: Secondary | ICD-10-CM | POA: Diagnosis not present

## 2023-10-30 DIAGNOSIS — F419 Anxiety disorder, unspecified: Secondary | ICD-10-CM | POA: Insufficient documentation

## 2023-10-30 DIAGNOSIS — G4700 Insomnia, unspecified: Secondary | ICD-10-CM | POA: Diagnosis not present

## 2023-10-30 DIAGNOSIS — D72829 Elevated white blood cell count, unspecified: Secondary | ICD-10-CM | POA: Diagnosis not present

## 2023-10-30 HISTORY — DX: Unspecified convulsions: R56.9

## 2023-10-30 LAB — I-STAT CG4 LACTIC ACID, ED
Lactic Acid, Venous: 2.7 mmol/L (ref 0.5–1.9)
Lactic Acid, Venous: 1.3 mmol/L (ref 0.5–1.9)

## 2023-10-30 LAB — CBC WITH DIFFERENTIAL/PLATELET
Abs Immature Granulocytes: 0.19 10*3/uL — ABNORMAL HIGH (ref 0.00–0.07)
Basophils Absolute: 0 10*3/uL (ref 0.0–0.1)
Basophils Relative: 0 %
Eosinophils Absolute: 0 10*3/uL (ref 0.0–0.5)
Eosinophils Relative: 0 %
HCT: 46.9 % — ABNORMAL HIGH (ref 36.0–46.0)
Hemoglobin: 14.7 g/dL (ref 12.0–15.0)
Immature Granulocytes: 1 %
Lymphocytes Relative: 8 %
Lymphs Abs: 1.1 10*3/uL (ref 0.7–4.0)
MCH: 30.8 pg (ref 26.0–34.0)
MCHC: 31.3 g/dL (ref 30.0–36.0)
MCV: 98.1 fL (ref 80.0–100.0)
Monocytes Absolute: 0.6 10*3/uL (ref 0.1–1.0)
Monocytes Relative: 4 %
Neutro Abs: 12.6 10*3/uL — ABNORMAL HIGH (ref 1.7–7.7)
Neutrophils Relative %: 87 %
Platelets: 266 10*3/uL (ref 150–400)
RBC: 4.78 MIL/uL (ref 3.87–5.11)
RDW: 12.7 % (ref 11.5–15.5)
WBC: 14.6 10*3/uL — ABNORMAL HIGH (ref 4.0–10.5)
nRBC: 0 % (ref 0.0–0.2)

## 2023-10-30 LAB — COMPREHENSIVE METABOLIC PANEL
ALT: 18 U/L (ref 0–44)
AST: 42 U/L — ABNORMAL HIGH (ref 15–41)
Albumin: 3.8 g/dL (ref 3.5–5.0)
Alkaline Phosphatase: 48 U/L (ref 38–126)
Anion gap: 14 (ref 5–15)
BUN: 7 mg/dL (ref 6–20)
CO2: 18 mmol/L — ABNORMAL LOW (ref 22–32)
Calcium: 9 mg/dL (ref 8.9–10.3)
Chloride: 106 mmol/L (ref 98–111)
Creatinine, Ser: 0.88 mg/dL (ref 0.44–1.00)
GFR, Estimated: >60 mL/min (ref >60)
Glucose, Bld: 107 mg/dL — ABNORMAL HIGH (ref 70–99)
Potassium: 4 mmol/L (ref 3.5–5.1)
Sodium: 138 mmol/L (ref 135–145)
Total Bilirubin: 0.9 mg/dL (ref <1.2)
Total Protein: 7.8 g/dL (ref 6.5–8.1)

## 2023-10-30 LAB — I-STAT CHEM 8, ED
BUN: 7 mg/dL (ref 6–20)
Calcium, Ion: 1.09 mmol/L — ABNORMAL LOW (ref 1.15–1.40)
Chloride: 107 mmol/L (ref 98–111)
Creatinine, Ser: 0.8 mg/dL (ref 0.44–1.00)
Glucose, Bld: 115 mg/dL — ABNORMAL HIGH (ref 70–99)
HCT: 47 % — ABNORMAL HIGH (ref 36.0–46.0)
Hemoglobin: 16 g/dL — ABNORMAL HIGH (ref 12.0–15.0)
Potassium: 3.8 mmol/L (ref 3.5–5.1)
Sodium: 138 mmol/L (ref 135–145)
TCO2: 21 mmol/L — ABNORMAL LOW (ref 22–32)

## 2023-10-30 LAB — HIV ANTIBODY (ROUTINE TESTING W REFLEX): HIV Screen 4th Generation wRfx: NONREACTIVE

## 2023-10-30 LAB — HCG, SERUM, QUALITATIVE: Preg, Serum: NEGATIVE

## 2023-10-30 LAB — ETHANOL: Alcohol, Ethyl (B): <10 mg/dL (ref <10)

## 2023-10-30 LAB — HEMOGLOBIN A1C
Hgb A1c MFr Bld: 5.4 % (ref 4.8–5.6)
Mean Plasma Glucose: 108.28 mg/dL

## 2023-10-30 LAB — SAMPLE TO BLOOD BANK

## 2023-10-30 MED ORDER — LIDOCAINE 5 % EX PTCH
3.0000 | MEDICATED_PATCH | CUTANEOUS | Status: DC
Start: 2023-10-30 — End: 2023-10-31
  Administered 2023-10-30: 3 via TRANSDERMAL
  Filled 2023-10-30: qty 3

## 2023-10-30 MED ORDER — OXYCODONE HCL 5 MG PO TABS: 5.0000 mg | ORAL_TABLET | ORAL | 0 refills | Status: DC | PRN

## 2023-10-30 MED ORDER — LEVETIRACETAM IN NACL 1500 MG/100ML IV SOLN
1500.0000 mg | Freq: Once | INTRAVENOUS | Status: DC
  Filled 2023-10-30: qty 100

## 2023-10-30 MED ORDER — ACETAMINOPHEN 500 MG PO TABS
1000.0000 mg | ORAL_TABLET | Freq: Three times a day (TID) | ORAL | Status: DC
  Administered 2023-10-30 – 2023-10-31 (×2): 1000 mg via ORAL
  Filled 2023-10-30 (×2): qty 2

## 2023-10-30 MED ORDER — HYDROMORPHONE HCL 1 MG/ML IJ SOLN
1.0000 mg | Freq: Once | INTRAMUSCULAR | Status: AC
  Administered 2023-10-30: 1 mg via INTRAVENOUS
  Filled 2023-10-30: qty 1

## 2023-10-30 MED ORDER — ALPRAZOLAM 0.25 MG PO TABS
1.0000 mg | ORAL_TABLET | Freq: Three times a day (TID) | ORAL | Status: DC | PRN
  Administered 2023-10-31: 1 mg via ORAL
  Filled 2023-10-30: qty 4

## 2023-10-30 MED ORDER — LEVETIRACETAM 500 MG PO TABS: 500.0000 mg | ORAL_TABLET | Freq: Two times a day (BID) | ORAL | 2 refills | Status: DC

## 2023-10-30 MED ORDER — LEVETIRACETAM 500 MG PO TABS
500.0000 mg | ORAL_TABLET | Freq: Two times a day (BID) | ORAL | Status: DC
  Filled 2023-10-30: qty 1

## 2023-10-30 MED ORDER — MUSCLE RUB 10-15 % EX CREA: TOPICAL_CREAM | CUTANEOUS | Status: DC | PRN

## 2023-10-30 MED ORDER — ACETAMINOPHEN 500 MG PO TABS
1000.0000 mg | ORAL_TABLET | ORAL | Status: AC
  Administered 2023-10-30: 1000 mg via ORAL
  Filled 2023-10-30: qty 2

## 2023-10-30 MED ORDER — KETOROLAC TROMETHAMINE 15 MG/ML IJ SOLN
15.0000 mg | Freq: Once | INTRAMUSCULAR | Status: AC
  Administered 2023-10-30: 15 mg via INTRAVENOUS
  Filled 2023-10-30: qty 1

## 2023-10-30 MED ORDER — IOHEXOL 300 MG/ML  SOLN
75.0000 mL | Freq: Once | INTRAMUSCULAR | Status: AC | PRN
  Administered 2023-10-30: 75 mL via INTRAVENOUS

## 2023-10-30 MED ORDER — ACETAMINOPHEN 325 MG PO TABS: 650.0000 mg | ORAL_TABLET | Freq: Four times a day (QID) | ORAL | Status: DC | PRN

## 2023-10-30 MED ORDER — OXYCODONE HCL 5 MG PO TABS
5.0000 mg | ORAL_TABLET | ORAL | Status: AC
  Administered 2023-10-30: 5 mg via ORAL
  Filled 2023-10-30: qty 1

## 2023-10-30 MED ORDER — KETOROLAC TROMETHAMINE 15 MG/ML IJ SOLN
15.0000 mg | Freq: Four times a day (QID) | INTRAMUSCULAR | Status: DC
  Administered 2023-10-30 – 2023-10-31 (×3): 15 mg via INTRAVENOUS
  Filled 2023-10-30 (×3): qty 1

## 2023-10-30 MED ORDER — HYDROMORPHONE HCL 1 MG/ML IJ SOLN
0.5000 mg | Freq: Once | INTRAMUSCULAR | Status: AC
  Administered 2023-10-30: 0.5 mg via INTRAVENOUS
  Filled 2023-10-30: qty 1
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
## 2023-10-30 NOTE — ED Provider Notes (Signed)
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 approximately: 1 week   10/30/23 1713              Rondel Baton, MD 10/30/23 2223

## 2023-10-30 NOTE — ED Notes (Signed)
Patients mother was upset that this nurse was unable to attend to her daughter for her pain meds, pts mother stated that she is also a nurse and that pain takes priority. I apologized to her for not making interventions sooner but unfortunately I was with a patient who was a code stroke who was a one on one. Pts mother stated yes that takes priority and asked what the nurse to patient ratio is in the ED, I explained to her that it is one nurse to four patients but we do not stay with one patient for the full shift and she stated "oh that's right this is not a med surg unit". I once again apologized to the patients mother, she verbalized that no one was at the nurses station when she came out of the room and that the CNA should have my back and I also explained to her that our CNAs are also doing their best with taking care of all the patients and pts mother stated that Rodney Booze Rn was able to help with her daughters pain medication and once again I explained to the mother stating I am grateful for my nursemates I explained to mother that she may speak to my charge nurse regarding her concerns and charge has been notified.Pts mother also requested that we do not give the Keppra to the patient until the MRI is done and EDP is aware of this as well.
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
## 2023-10-30 NOTE — ED Notes (Addendum)
Patient used bedpan with assistance. CNS intact. Full linen changed.

## 2023-10-31 ENCOUNTER — Other Ambulatory Visit (HOSPITAL_COMMUNITY): Payer: Self-pay

## 2023-10-31 ENCOUNTER — Observation Stay (HOSPITAL_COMMUNITY): Payer: 59

## 2023-10-31 DIAGNOSIS — R569 Unspecified convulsions: Secondary | ICD-10-CM | POA: Diagnosis not present

## 2023-10-31 LAB — CBC
HCT: 43.7 % (ref 36.0–46.0)
Hemoglobin: 14.9 g/dL (ref 12.0–15.0)
MCH: 31.6 pg (ref 26.0–34.0)
MCHC: 34.1 g/dL (ref 30.0–36.0)
MCV: 92.6 fL (ref 80.0–100.0)
Platelets: 295 10*3/uL (ref 150–400)
RBC: 4.72 MIL/uL (ref 3.87–5.11)
RDW: 12.7 % (ref 11.5–15.5)
WBC: 10.1 10*3/uL (ref 4.0–10.5)
nRBC: 0 % (ref 0.0–0.2)

## 2023-10-31 LAB — RAPID URINE DRUG SCREEN, HOSP PERFORMED
Amphetamines: NOT DETECTED
Barbiturates: NOT DETECTED
Benzodiazepines: POSITIVE — AB
Cocaine: NOT DETECTED
Opiates: NOT DETECTED
Tetrahydrocannabinol: NOT DETECTED

## 2023-10-31 LAB — COMPREHENSIVE METABOLIC PANEL
ALT: 21 U/L (ref 0–44)
AST: 57 U/L — ABNORMAL HIGH (ref 15–41)
Albumin: 3.5 g/dL (ref 3.5–5.0)
Alkaline Phosphatase: 46 U/L (ref 38–126)
Anion gap: 11 (ref 5–15)
BUN: 5 mg/dL — ABNORMAL LOW (ref 6–20)
CO2: 21 mmol/L — ABNORMAL LOW (ref 22–32)
Calcium: 9.1 mg/dL (ref 8.9–10.3)
Chloride: 103 mmol/L (ref 98–111)
Creatinine, Ser: 0.88 mg/dL (ref 0.44–1.00)
GFR, Estimated: >60 mL/min (ref >60)
Glucose, Bld: 87 mg/dL (ref 70–99)
Potassium: 3.6 mmol/L (ref 3.5–5.1)
Sodium: 135 mmol/L (ref 135–145)
Total Bilirubin: 0.9 mg/dL (ref <1.2)
Total Protein: 7.2 g/dL (ref 6.5–8.1)

## 2023-10-31 LAB — URINALYSIS, ROUTINE W REFLEX MICROSCOPIC
Bacteria, UA: NONE SEEN
Bilirubin Urine: NEGATIVE
Glucose, UA: NEGATIVE mg/dL
Ketones, ur: 80 mg/dL — AB
Leukocytes,Ua: NEGATIVE
Nitrite: NEGATIVE
Protein, ur: 30 mg/dL — AB
Specific Gravity, Urine: 1.033 — ABNORMAL HIGH (ref 1.005–1.030)
pH: 6 (ref 5.0–8.0)

## 2023-10-31 LAB — MAGNESIUM: Magnesium: 2.3 mg/dL (ref 1.7–2.4)

## 2023-10-31 MED ORDER — ZOLPIDEM TARTRATE 10 MG PO TABS: 5.0000 mg | ORAL_TABLET | Freq: Every evening | ORAL | Status: DC | PRN

## 2023-10-31 MED ORDER — LEVETIRACETAM 500 MG PO TABS
500.0000 mg | ORAL_TABLET | Freq: Two times a day (BID) | ORAL | 2 refills | Status: DC
  Filled 2023-10-31: qty 60, 30d supply, fill #0
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
## 2023-10-31 NOTE — Plan of Care (Signed)
Pt alert and oriented x 4. Complains of back pain, see MAR. IV was patent and removed for discharge. Continent of bowel and bladder. Discharge instructions completed with pt and family. All questions and concerns answered. Pt stable upon discharge.   Problem: Education: Goal: Knowledge of General Education information will improve Description: Including pain rating scale, medication(s)/side effects and non-pharmacologic comfort measures Outcome: Completed/Met   Problem: Health Behavior/Discharge Planning: Goal: Ability to manage health-related needs will improve Outcome: Completed/Met   Problem: Clinical Measurements: Goal: Ability to maintain clinical measurements within normal limits will improve Outcome: Completed/Met Goal: Will remain free from infection Outcome: Completed/Met Goal: Diagnostic test results will improve Outcome: Completed/Met Goal: Respiratory complications will improve Outcome: Completed/Met Goal: Cardiovascular complication will be avoided Outcome: Completed/Met   Problem: Activity: Goal: Risk for activity intolerance will decrease Outcome: Completed/Met   Problem: Nutrition: Goal: Adequate nutrition will be maintained Outcome: Completed/Met   Problem: Coping: Goal: Level of anxiety will decrease Outcome: Completed/Met   Problem: Elimination: Goal: Will not experience complications related to bowel motility Outcome: Completed/Met Goal: Will not experience complications related to urinary retention Outcome: Completed/Met   Problem: Pain Management: Goal: General experience of comfort will improve Outcome: Completed/Met   Problem: Safety: Goal: Ability to remain free from injury will improve Outcome: Completed/Met   Problem: Skin Integrity: Goal: Risk for impaired skin integrity will decrease Outcome: Completed/Met

## 2023-10-31 NOTE — Progress Notes (Signed)
EEG complete - results pending 

## 2023-10-31 NOTE — Procedures (Signed)
Routine EEG Report  Meghan [name] is a 47 y.o. female with a history of seizure who is undergoing an EEG to evaluate for seizures.  Report: This EEG was acquired with electrodes placed according to the International 10-20 electrode system (including Fp1, Fp2, F3, F4, C3, C4, P3, P4, O1, O2, T3, T4, T5, T6, A1, A2, Fz, Cz, Pz). The following electrodes were missing or displaced: none.  The occipital dominant rhythm was 9 Hz. This activity is reactive to stimulation. Drowsiness was manifested by background fragmentation; deeper stages of sleep were not identified. There was no focal slowing. There were no interictal epileptiform discharges. There were no electrographic seizures identified. There was no abnormal response to photic stimulation or hyperventilation.   Impression: This EEG was obtained while awake and drowsy and is normal.    Clinical Correlation: Normal EEGs, however, do not rule out epilepsy.  Bing Neighbors, MD [facility] [phone]  If 7pm- 7am, please page neurology on call as listed in [facility].
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
## 2023-11-03 ENCOUNTER — Ambulatory Visit: Payer: 59 | Admitting: Internal Medicine

## 2023-11-03 ENCOUNTER — Encounter: Payer: Self-pay | Admitting: Internal Medicine

## 2023-11-03 VITALS — BP 110/60 | HR 59 | Temp 98.7°F | Ht 63.0 in | Wt 199.0 lb

## 2023-11-03 DIAGNOSIS — R87619 Unspecified abnormal cytological findings in specimens from cervix uteri: Secondary | ICD-10-CM

## 2023-11-03 DIAGNOSIS — S22008D Other fracture of unspecified thoracic vertebra, subsequent encounter for fracture with routine healing: Secondary | ICD-10-CM

## 2023-11-03 DIAGNOSIS — S134XXA Sprain of ligaments of cervical spine, initial encounter: Secondary | ICD-10-CM | POA: Insufficient documentation

## 2023-11-03 DIAGNOSIS — S22009A Unspecified fracture of unspecified thoracic vertebra, initial encounter for closed fracture: Secondary | ICD-10-CM | POA: Insufficient documentation

## 2023-11-03 DIAGNOSIS — S060XAA Concussion with loss of consciousness status unknown, initial encounter: Secondary | ICD-10-CM

## 2023-11-03 DIAGNOSIS — S134XXD Sprain of ligaments of cervical spine, subsequent encounter: Secondary | ICD-10-CM | POA: Diagnosis not present

## 2023-11-03 DIAGNOSIS — S060X9D Concussion with loss of consciousness of unspecified duration, subsequent encounter: Secondary | ICD-10-CM

## 2023-11-03 DIAGNOSIS — R569 Unspecified convulsions: Secondary | ICD-10-CM

## 2023-11-03 HISTORY — DX: Concussion with loss of consciousness status unknown, initial encounter: S06.0XAA

## 2023-11-03 HISTORY — DX: Unspecified fracture of unspecified thoracic vertebra, initial encounter for closed fracture: S22.009A

## 2023-11-03 HISTORY — DX: Unspecified abnormal cytological findings in specimens from cervix uteri: R87.619

## 2023-11-03 HISTORY — DX: Sprain of ligaments of cervical spine, initial encounter: S13.4XXA

## 2023-11-03 MED ORDER — HYDROCODONE-ACETAMINOPHEN 5-325 MG PO TABS: 1.0000 | ORAL_TABLET | Freq: Four times a day (QID) | ORAL | 0 refills | Status: DC | PRN

## 2023-11-03 MED ORDER — METHOCARBAMOL 500 MG PO TABS: 500.0000 mg | ORAL_TABLET | Freq: Three times a day (TID) | ORAL | 1 refills | Status: DC | PRN

## 2023-11-03 NOTE — Telephone Encounter (Signed)
error 
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
## 2023-11-03 NOTE — Assessment & Plan Note (Addendum)
Norco prn PT CT: Mild T4-T7 superior endplate compression fractures and a mild compression fracture asymmetrically involving the left aspect of the T12 vertebral body. None of these are clearly acute, although the T12 fracture is new or progressive from the 2021 MRI and the T4-T7 fractures appear to be new from the 2012 radiographs.
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
## 2023-11-08 NOTE — Assessment & Plan Note (Signed)
Probable concussion.  Discussed.

## 2023-11-11 ENCOUNTER — Telehealth: Payer: Self-pay | Admitting: Internal Medicine

## 2023-11-11 DIAGNOSIS — R569 Unspecified convulsions: Secondary | ICD-10-CM

## 2023-11-11 NOTE — Telephone Encounter (Signed)
Patient called and said her short term disability paperwork was faxed on 11/10/23. She would like to make sure it has been received. Patient would like a call back at [phone].

## 2023-11-12 NOTE — Telephone Encounter (Signed)
LVM for pt informing her the forms have been received and awaiting providers approval and signature.
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
## 2023-11-13 ENCOUNTER — Other Ambulatory Visit: Payer: Self-pay

## 2023-11-13 ENCOUNTER — Ambulatory Visit: Payer: 59 | Attending: Family Medicine | Admitting: Physical Therapy

## 2023-11-13 DIAGNOSIS — M5442 Lumbago with sciatica, left side: Secondary | ICD-10-CM | POA: Insufficient documentation

## 2023-11-13 DIAGNOSIS — R252 Cramp and spasm: Secondary | ICD-10-CM | POA: Diagnosis present

## 2023-11-13 DIAGNOSIS — M546 Pain in thoracic spine: Secondary | ICD-10-CM | POA: Diagnosis present

## 2023-11-13 DIAGNOSIS — M25572 Pain in left ankle and joints of left foot: Secondary | ICD-10-CM | POA: Insufficient documentation

## 2023-11-13 DIAGNOSIS — M542 Cervicalgia: Secondary | ICD-10-CM | POA: Insufficient documentation

## 2023-11-13 DIAGNOSIS — M5459 Other low back pain: Secondary | ICD-10-CM | POA: Insufficient documentation

## 2023-11-16 ENCOUNTER — Encounter: Payer: Self-pay | Admitting: Physical Therapy

## 2023-11-16 ENCOUNTER — Ambulatory Visit (INDEPENDENT_AMBULATORY_CARE_PROVIDER_SITE_OTHER): Payer: 59 | Admitting: Family Medicine

## 2023-11-16 ENCOUNTER — Ambulatory Visit: Payer: 59 | Admitting: Physical Therapy

## 2023-11-16 ENCOUNTER — Encounter: Payer: Self-pay | Admitting: Family Medicine

## 2023-11-16 ENCOUNTER — Ambulatory Visit (HOSPITAL_BASED_OUTPATIENT_CLINIC_OR_DEPARTMENT_OTHER)
Admission: RE | Admit: 2023-11-16 | Discharge: 2023-11-16 | Disposition: A | Discharge status: Home / Self Care | Payer: 59 | Source: Ambulatory Visit | Attending: Family Medicine | Admitting: Family Medicine

## 2023-11-16 VITALS — BP 126/78 | Ht 63.0 in | Wt 199.0 lb

## 2023-11-16 DIAGNOSIS — S99912A Unspecified injury of left ankle, initial encounter: Secondary | ICD-10-CM | POA: Insufficient documentation

## 2023-11-16 DIAGNOSIS — M542 Cervicalgia: Secondary | ICD-10-CM | POA: Diagnosis not present

## 2023-11-16 DIAGNOSIS — R252 Cramp and spasm: Secondary | ICD-10-CM

## 2023-11-16 DIAGNOSIS — M546 Pain in thoracic spine: Secondary | ICD-10-CM

## 2023-11-16 DIAGNOSIS — M5459 Other low back pain: Secondary | ICD-10-CM

## 2023-11-16 DIAGNOSIS — M79672 Pain in left foot: Secondary | ICD-10-CM

## 2023-11-16 DIAGNOSIS — M25572 Pain in left ankle and joints of left foot: Secondary | ICD-10-CM

## 2023-11-16 DIAGNOSIS — M5442 Lumbago with sciatica, left side: Secondary | ICD-10-CM
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
## 2023-11-17 NOTE — Telephone Encounter (Signed)
Patient following up on the below requests for   Vicodin refill, as she is using twice a day for pain including bedtime Patient following up on STD paper work, told her we ask for 7-10 days and we should have done by the end of this week 12/13.  Also asked about Neuro referral as she has not heard back on that.   Told patient will forward to provider and team to review

## 2023-11-17 NOTE — Telephone Encounter (Signed)
Patient states her fmla forms need to be in by tomorrow to her work.  Please check

## 2023-11-17 NOTE — Telephone Encounter (Signed)
Patient is also checking on the refill for her vicodin that was supposed to be sent in, please advise

## 2023-11-18 ENCOUNTER — Other Ambulatory Visit: Payer: Self-pay | Admitting: Family Medicine

## 2023-11-18 ENCOUNTER — Encounter: Payer: Self-pay | Admitting: Family Medicine

## 2023-11-18 ENCOUNTER — Other Ambulatory Visit: Payer: Self-pay

## 2023-11-18 DIAGNOSIS — R200 Anesthesia of skin: Secondary | ICD-10-CM

## 2023-11-18 DIAGNOSIS — M48061 Spinal stenosis, lumbar region without neurogenic claudication: Secondary | ICD-10-CM

## 2023-11-18 MED ORDER — HYDROCODONE-ACETAMINOPHEN 5-325 MG PO TABS: 1.0000 | ORAL_TABLET | Freq: Four times a day (QID) | ORAL | 0 refills | Status: DC | PRN

## 2023-11-18 NOTE — Telephone Encounter (Signed)
I will change the neurology referral destination. Prescription was renewed. The patient should see me for a follow-up in the next week or 2.  Thank you

## 2023-11-18 NOTE — Telephone Encounter (Signed)
Spoke with Dr.Plotnikov. Stated he would be working on the referral out to [facility]. I have since pended the refill for the Vicodin and we are working on update for the paperwork.

## 2023-11-18 NOTE — Addendum Note (Signed)
Addended by: Tresa Garter on: 11/18/2023 01:35 PM   Modules accepted: Orders

## 2023-11-19 ENCOUNTER — Other Ambulatory Visit (HOSPITAL_COMMUNITY): Payer: Self-pay

## 2023-11-19 ENCOUNTER — Ambulatory Visit: Payer: 59

## 2023-11-19 ENCOUNTER — Telehealth: Payer: Self-pay

## 2023-11-19 DIAGNOSIS — M5442 Lumbago with sciatica, left side: Secondary | ICD-10-CM

## 2023-11-19 DIAGNOSIS — M542 Cervicalgia: Secondary | ICD-10-CM

## 2023-11-19 DIAGNOSIS — M5459 Other low back pain: Secondary | ICD-10-CM

## 2023-11-19 DIAGNOSIS — M546 Pain in thoracic spine: Secondary | ICD-10-CM

## 2023-11-19 DIAGNOSIS — M25572 Pain in left ankle and joints of left foot: Secondary | ICD-10-CM

## 2023-11-19 DIAGNOSIS — R252 Cramp and spasm: Secondary | ICD-10-CM
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
## 2023-11-19 NOTE — Telephone Encounter (Signed)
*  Primary  Pharmacy Patient Advocate Encounter   Received notification from CoverMyMeds that prior authorization for HYDROcodone-Acetaminophen 5-325MG  tablets  is required/requested.   Insurance verification completed.   The patient is insured through [employer] [facility] .   Per test claim: PA required; PA submitted to above mentioned insurance via CoverMyMeds Key/confirmation #/EOC [id number] Status is pending

## 2023-11-20 ENCOUNTER — Other Ambulatory Visit: Payer: Self-pay | Admitting: Family Medicine

## 2023-11-20 NOTE — Telephone Encounter (Signed)
Your PA request has been closed. No PA required at this time.Please have the pharmacy process the request.pharmacy eeror codes must be handled by the dispensing pharmacy.

## 2023-11-20 NOTE — Progress Notes (Unsigned)
Error

## 2023-11-23 ENCOUNTER — Encounter: Payer: Self-pay | Admitting: Family Medicine

## 2023-11-23 ENCOUNTER — Telehealth: Payer: Self-pay | Admitting: Internal Medicine

## 2023-11-23 NOTE — Telephone Encounter (Signed)
Patient called states she would like a call back to address some concerns she has. Best callback is [phone].

## 2023-11-23 NOTE — Telephone Encounter (Signed)
Spoke with pt and was able to assist in the information she is needing for her disability paperwork.

## 2023-11-23 NOTE — Progress Notes (Signed)
L spine MRI ordered

## 2023-11-24 ENCOUNTER — Ambulatory Visit: Payer: 59

## 2023-11-24 DIAGNOSIS — M546 Pain in thoracic spine: Secondary | ICD-10-CM

## 2023-11-24 DIAGNOSIS — M542 Cervicalgia: Secondary | ICD-10-CM

## 2023-11-24 DIAGNOSIS — M5459 Other low back pain: Secondary | ICD-10-CM

## 2023-11-24 DIAGNOSIS — M25572 Pain in left ankle and joints of left foot: Secondary | ICD-10-CM

## 2023-11-24 DIAGNOSIS — R252 Cramp and spasm: Secondary | ICD-10-CM

## 2023-11-24 DIAGNOSIS — M5442 Lumbago with sciatica, left side: Secondary | ICD-10-CM
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
## 2023-11-25 ENCOUNTER — Ambulatory Visit
Admission: RE | Admit: 2023-11-25 | Discharge: 2023-11-25 | Disposition: A | Discharge status: Home / Self Care | Payer: 59 | Source: Ambulatory Visit | Attending: Family Medicine | Admitting: Family Medicine

## 2023-11-25 DIAGNOSIS — M48061 Spinal stenosis, lumbar region without neurogenic claudication: Secondary | ICD-10-CM

## 2023-11-25 DIAGNOSIS — R200 Anesthesia of skin: Secondary | ICD-10-CM

## 2023-11-27 ENCOUNTER — Ambulatory Visit: Payer: 59

## 2023-11-27 DIAGNOSIS — M546 Pain in thoracic spine: Secondary | ICD-10-CM

## 2023-11-27 DIAGNOSIS — R252 Cramp and spasm: Secondary | ICD-10-CM

## 2023-11-27 DIAGNOSIS — M542 Cervicalgia: Secondary | ICD-10-CM | POA: Diagnosis not present

## 2023-11-27 DIAGNOSIS — M25572 Pain in left ankle and joints of left foot: Secondary | ICD-10-CM

## 2023-11-27 DIAGNOSIS — M5442 Lumbago with sciatica, left side: Secondary | ICD-10-CM

## 2023-11-27 DIAGNOSIS — M5459 Other low back pain: Secondary | ICD-10-CM
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
## 2023-12-01 ENCOUNTER — Ambulatory Visit: Payer: 59

## 2023-12-01 ENCOUNTER — Encounter: Payer: Self-pay | Admitting: Family Medicine

## 2023-12-01 ENCOUNTER — Ambulatory Visit: Payer: 59 | Admitting: Family Medicine

## 2023-12-01 VITALS — BP 120/82 | Ht 63.0 in | Wt 199.0 lb

## 2023-12-01 DIAGNOSIS — R252 Cramp and spasm: Secondary | ICD-10-CM

## 2023-12-01 DIAGNOSIS — M5442 Lumbago with sciatica, left side: Secondary | ICD-10-CM

## 2023-12-01 DIAGNOSIS — M5459 Other low back pain: Secondary | ICD-10-CM

## 2023-12-01 DIAGNOSIS — S93402D Sprain of unspecified ligament of left ankle, subsequent encounter: Secondary | ICD-10-CM

## 2023-12-01 DIAGNOSIS — R200 Anesthesia of skin: Secondary | ICD-10-CM | POA: Diagnosis not present

## 2023-12-01 DIAGNOSIS — M25572 Pain in left ankle and joints of left foot: Secondary | ICD-10-CM

## 2023-12-01 DIAGNOSIS — M546 Pain in thoracic spine: Secondary | ICD-10-CM

## 2023-12-01 DIAGNOSIS — M542 Cervicalgia: Secondary | ICD-10-CM | POA: Diagnosis not present

## 2023-12-01 MED ORDER — METHOCARBAMOL 500 MG PO TABS: 500.0000 mg | ORAL_TABLET | Freq: Three times a day (TID) | ORAL | 1 refills | Status: DC | PRN

## 2023-12-01 MED ORDER — CELECOXIB 100 MG PO CAPS: 200.0000 mg | ORAL_CAPSULE | Freq: Two times a day (BID) | ORAL | 0 refills | Status: DC
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
## 2023-12-03 ENCOUNTER — Ambulatory Visit: Payer: 59 | Admitting: Internal Medicine

## 2023-12-03 VITALS — BP 118/80 | HR 73 | Temp 98.3°F | Ht 63.0 in | Wt 200.0 lb

## 2023-12-03 DIAGNOSIS — R569 Unspecified convulsions: Secondary | ICD-10-CM

## 2023-12-03 DIAGNOSIS — E559 Vitamin D deficiency, unspecified: Secondary | ICD-10-CM | POA: Diagnosis not present

## 2023-12-03 DIAGNOSIS — S134XXD Sprain of ligaments of cervical spine, subsequent encounter: Secondary | ICD-10-CM | POA: Diagnosis not present

## 2023-12-03 LAB — CBC WITH DIFFERENTIAL/PLATELET
Basophils Absolute: 0 10*3/uL (ref 0.0–0.1)
Basophils Relative: 0.7 % (ref 0.0–3.0)
Eosinophils Absolute: 0.2 10*3/uL (ref 0.0–0.7)
Eosinophils Relative: 3 % (ref 0.0–5.0)
HCT: 44.7 % (ref 36.0–46.0)
Hemoglobin: 14.8 g/dL (ref 12.0–15.0)
Lymphocytes Relative: 41.2 % (ref 12.0–46.0)
Lymphs Abs: 2.6 10*3/uL (ref 0.7–4.0)
MCHC: 33.2 g/dL (ref 30.0–36.0)
MCV: 95.2 fL (ref 78.0–100.0)
Monocytes Absolute: 0.5 10*3/uL (ref 0.1–1.0)
Monocytes Relative: 8.3 % (ref 3.0–12.0)
Neutro Abs: 3 10*3/uL (ref 1.4–7.7)
Neutrophils Relative %: 46.8 % (ref 43.0–77.0)
Platelets: 303 10*3/uL (ref 150.0–400.0)
RBC: 4.69 Mil/uL (ref 3.87–5.11)
RDW: 13.1 % (ref 11.5–15.5)
WBC: 6.3 10*3/uL (ref 4.0–10.5)

## 2023-12-03 LAB — COMPREHENSIVE METABOLIC PANEL
ALT: 15 U/L (ref 0–35)
AST: 18 U/L (ref 0–37)
Albumin: 4.2 g/dL (ref 3.5–5.2)
Alkaline Phosphatase: 65 U/L (ref 39–117)
BUN: 10 mg/dL (ref 6–23)
CO2: 28 meq/L (ref 19–32)
Calcium: 9.7 mg/dL (ref 8.4–10.5)
Chloride: 100 meq/L (ref 96–112)
Creatinine, Ser: 0.72 mg/dL (ref 0.40–1.20)
GFR: 99.53 mL/min (ref >60.00)
Glucose, Bld: 87 mg/dL (ref 70–99)
Potassium: 4 meq/L (ref 3.5–5.1)
Sodium: 135 meq/L (ref 135–145)
Total Bilirubin: 0.5 mg/dL (ref 0.2–1.2)
Total Protein: 7.9 g/dL (ref 6.0–8.3)

## 2023-12-03 LAB — LIPID PANEL
Cholesterol: 180 mg/dL (ref 0–200)
HDL: 53 mg/dL (ref >39.00)
LDL Cholesterol: 112 mg/dL — ABNORMAL HIGH (ref 0–99)
NonHDL: 127.3
Total CHOL/HDL Ratio: 3
Triglycerides: 79 mg/dL (ref 0.0–149.0)
VLDL: 15.8 mg/dL (ref 0.0–40.0)

## 2023-12-03 LAB — HEPATIC FUNCTION PANEL
ALT: 15 U/L (ref 0–35)
AST: 18 U/L (ref 0–37)
Albumin: 4.2 g/dL (ref 3.5–5.2)
Alkaline Phosphatase: 65 U/L (ref 39–117)
Bilirubin, Direct: 0.1 mg/dL (ref 0.0–0.3)
Total Bilirubin: 0.5 mg/dL (ref 0.2–1.2)
Total Protein: 7.9 g/dL (ref 6.0–8.3)

## 2023-12-03 LAB — VITAMIN D 25 HYDROXY (VIT D DEFICIENCY, FRACTURES): VITD: 22.37 ng/mL — ABNORMAL LOW (ref 30.00–100.00)

## 2023-12-03 LAB — TSH: TSH: 0.68 u[IU]/mL (ref 0.35–5.50)

## 2023-12-03 LAB — VITAMIN B12: Vitamin B-12: 213 pg/mL (ref 211–911)

## 2023-12-03 LAB — HEMOGLOBIN A1C: Hgb A1c MFr Bld: 5.8 % (ref 4.6–6.5)

## 2023-12-03 MED ORDER — HYDROCODONE-ACETAMINOPHEN 5-325 MG PO TABS: 1.0000 | ORAL_TABLET | Freq: Four times a day (QID) | ORAL | 0 refills | Status: DC | PRN
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
## 2023-12-03 NOTE — Patient Instructions (Signed)
  Rice bag/sock for heat

## 2023-12-03 NOTE — Assessment & Plan Note (Signed)
Probable Pt states she was taking Xanax, Zolpidem infrequently EEG (-) Per [facility] statutes, patients with seizures are not allowed to drive until they have been seizure-free for six months.  Neurology appt is pending - 12/25/23 - trying to move up appt
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
## 2023-12-03 NOTE — Assessment & Plan Note (Signed)
On Vit D 

## 2023-12-07 ENCOUNTER — Encounter: Payer: Self-pay | Admitting: Internal Medicine

## 2023-12-07 ENCOUNTER — Other Ambulatory Visit: Payer: Self-pay | Admitting: Internal Medicine

## 2023-12-07 MED ORDER — VITAMIN D3 50 MCG (2000 UT) PO CAPS: 2000.0000 [IU] | ORAL_CAPSULE | Freq: Every day | ORAL | 3 refills | Status: AC

## 2023-12-07 MED ORDER — VITAMIN D (ERGOCALCIFEROL) 1.25 MG (50000 UNIT) PO CAPS: 50000.0000 [IU] | ORAL_CAPSULE | ORAL | 0 refills | Status: DC

## 2023-12-07 MED ORDER — VITAMIN B-12 1000 MCG SL SUBL: 1.0000 | SUBLINGUAL_TABLET | Freq: Every day | SUBLINGUAL | 3 refills | Status: AC

## 2023-12-08 ENCOUNTER — Encounter: Payer: Self-pay | Admitting: Internal Medicine

## 2023-12-08 ENCOUNTER — Other Ambulatory Visit: Payer: Self-pay | Admitting: Family Medicine

## 2023-12-08 ENCOUNTER — Ambulatory Visit: Payer: 59

## 2023-12-08 DIAGNOSIS — R252 Cramp and spasm: Secondary | ICD-10-CM

## 2023-12-08 DIAGNOSIS — M546 Pain in thoracic spine: Secondary | ICD-10-CM

## 2023-12-08 DIAGNOSIS — M542 Cervicalgia: Secondary | ICD-10-CM | POA: Diagnosis not present

## 2023-12-08 DIAGNOSIS — M5459 Other low back pain: Secondary | ICD-10-CM

## 2023-12-08 DIAGNOSIS — M25572 Pain in left ankle and joints of left foot: Secondary | ICD-10-CM

## 2023-12-08 DIAGNOSIS — M5442 Lumbago with sciatica, left side: Secondary | ICD-10-CM

## 2023-12-08 MED ORDER — DICLOFENAC SODIUM 75 MG PO TBEC: 75.0000 mg | DELAYED_RELEASE_TABLET | Freq: Two times a day (BID) | ORAL | 0 refills | Status: DC

## 2023-12-08 NOTE — Progress Notes (Signed)
Dc celebrex d/t developing headaches on lowest dose; trial voltaren PO BID PRN
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
## 2023-12-11 ENCOUNTER — Ambulatory Visit: Payer: 59 | Attending: Family Medicine

## 2023-12-11 ENCOUNTER — Other Ambulatory Visit: Payer: Self-pay

## 2023-12-11 DIAGNOSIS — M542 Cervicalgia: Secondary | ICD-10-CM

## 2023-12-11 DIAGNOSIS — M5442 Lumbago with sciatica, left side: Secondary | ICD-10-CM

## 2023-12-11 DIAGNOSIS — M546 Pain in thoracic spine: Secondary | ICD-10-CM

## 2023-12-11 DIAGNOSIS — M25572 Pain in left ankle and joints of left foot: Secondary | ICD-10-CM | POA: Diagnosis present

## 2023-12-11 DIAGNOSIS — M5459 Other low back pain: Secondary | ICD-10-CM | POA: Diagnosis present

## 2023-12-11 DIAGNOSIS — R252 Cramp and spasm: Secondary | ICD-10-CM | POA: Diagnosis present
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
## 2023-12-11 NOTE — Progress Notes (Signed)
 error

## 2023-12-15 ENCOUNTER — Encounter: Payer: Self-pay | Admitting: Physical Therapy

## 2023-12-15 ENCOUNTER — Telehealth: Payer: Self-pay | Admitting: Diagnostic Neuroimaging

## 2023-12-15 ENCOUNTER — Ambulatory Visit (INDEPENDENT_AMBULATORY_CARE_PROVIDER_SITE_OTHER): Payer: BC Managed Care – PPO | Admitting: Diagnostic Neuroimaging

## 2023-12-15 ENCOUNTER — Encounter: Payer: Self-pay | Admitting: Diagnostic Neuroimaging

## 2023-12-15 ENCOUNTER — Ambulatory Visit: Payer: 59 | Admitting: Physical Therapy

## 2023-12-15 VITALS — BP 143/69 | HR 79 | Ht 63.0 in | Wt 199.6 lb

## 2023-12-15 DIAGNOSIS — S22080S Wedge compression fracture of T11-T12 vertebra, sequela: Secondary | ICD-10-CM

## 2023-12-15 DIAGNOSIS — M5459 Other low back pain: Secondary | ICD-10-CM

## 2023-12-15 DIAGNOSIS — H0520 Unspecified exophthalmos: Secondary | ICD-10-CM

## 2023-12-15 DIAGNOSIS — M542 Cervicalgia: Secondary | ICD-10-CM

## 2023-12-15 DIAGNOSIS — R569 Unspecified convulsions: Secondary | ICD-10-CM | POA: Diagnosis not present

## 2023-12-15 DIAGNOSIS — M546 Pain in thoracic spine: Secondary | ICD-10-CM

## 2023-12-15 NOTE — Telephone Encounter (Signed)
 Pt came for appointment today and paid $50 towards bad debt. She states she also made a $50 payment over the phone back in December. She is requesting a receipt for the December payment to be emailed to her at [email or web address].

## 2023-12-15 NOTE — Patient Instructions (Signed)

## 2023-12-15 NOTE — Patient Instructions (Addendum)
  NEW ONSET LOSS OF CONSCIOUSNESS (possible new onset seizure 10/30/23) - hold off on anti-seizure meds; did not start from hospital discharge; also, no clear history of prior events  - According to [place] law, you can not drive unless you are seizure / syncope free for at least 6 months and under physician's care.   - Please maintain precautions. Do not participate in activities where a loss of awareness could harm you or someone else. No swimming alone, no tub bathing, no hot tubs, no driving, no operating motorized vehicles (cars, ATVs, motocycles, etc), lawnmowers, power tools or firearms. No standing at heights, such as rooftops, ladders or stairs. Avoid hot objects such as stoves, heaters, open fires. Wear a helmet when riding a bicycle, scooter, skateboard, etc. and avoid areas of traffic. Set your water heater to 120 degrees or less.   T12 compression fracture / back pain - refer to [facility] for eval and pain mgmt  Right proptosis - refer to ophthalmology
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
## 2023-12-15 NOTE — Telephone Encounter (Signed)
 Referral sent to [facility]

## 2023-12-16 ENCOUNTER — Telehealth: Payer: Self-pay | Admitting: Diagnostic Neuroimaging

## 2023-12-16 NOTE — Telephone Encounter (Signed)
Referral for ophthalmology fax to [facility] Phone:  [phone], Fax: [phone].

## 2023-12-18 ENCOUNTER — Ambulatory Visit: Payer: 59 | Admitting: Physical Therapy

## 2023-12-18 ENCOUNTER — Encounter: Payer: Self-pay | Admitting: Physical Therapy

## 2023-12-18 DIAGNOSIS — M5442 Lumbago with sciatica, left side: Secondary | ICD-10-CM

## 2023-12-18 DIAGNOSIS — M542 Cervicalgia: Secondary | ICD-10-CM

## 2023-12-18 DIAGNOSIS — M25572 Pain in left ankle and joints of left foot: Secondary | ICD-10-CM

## 2023-12-18 DIAGNOSIS — M546 Pain in thoracic spine: Secondary | ICD-10-CM

## 2023-12-18 DIAGNOSIS — M5459 Other low back pain: Secondary | ICD-10-CM

## 2023-12-18 DIAGNOSIS — R252 Cramp and spasm: Secondary | ICD-10-CM
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
## 2023-12-21 ENCOUNTER — Other Ambulatory Visit: Payer: Self-pay | Admitting: Family Medicine

## 2023-12-22 ENCOUNTER — Ambulatory Visit: Payer: 59

## 2023-12-22 DIAGNOSIS — M5459 Other low back pain: Secondary | ICD-10-CM

## 2023-12-22 DIAGNOSIS — M25572 Pain in left ankle and joints of left foot: Secondary | ICD-10-CM

## 2023-12-22 DIAGNOSIS — R252 Cramp and spasm: Secondary | ICD-10-CM

## 2023-12-22 DIAGNOSIS — M542 Cervicalgia: Secondary | ICD-10-CM

## 2023-12-22 DIAGNOSIS — M546 Pain in thoracic spine: Secondary | ICD-10-CM

## 2023-12-22 DIAGNOSIS — M5442 Lumbago with sciatica, left side: Secondary | ICD-10-CM
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
## 2023-12-25 ENCOUNTER — Ambulatory Visit: Payer: 59

## 2023-12-25 ENCOUNTER — Ambulatory Visit: Payer: BC Managed Care – PPO | Admitting: Diagnostic Neuroimaging

## 2023-12-25 DIAGNOSIS — R252 Cramp and spasm: Secondary | ICD-10-CM

## 2023-12-25 DIAGNOSIS — M542 Cervicalgia: Secondary | ICD-10-CM

## 2023-12-25 DIAGNOSIS — M5459 Other low back pain: Secondary | ICD-10-CM

## 2023-12-25 DIAGNOSIS — M25572 Pain in left ankle and joints of left foot: Secondary | ICD-10-CM

## 2023-12-25 DIAGNOSIS — M546 Pain in thoracic spine: Secondary | ICD-10-CM

## 2023-12-25 DIAGNOSIS — M5442 Lumbago with sciatica, left side: Secondary | ICD-10-CM
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
## 2023-12-29 ENCOUNTER — Ambulatory Visit: Payer: 59

## 2023-12-29 DIAGNOSIS — R252 Cramp and spasm: Secondary | ICD-10-CM

## 2023-12-29 DIAGNOSIS — M5442 Lumbago with sciatica, left side: Secondary | ICD-10-CM

## 2023-12-29 DIAGNOSIS — M546 Pain in thoracic spine: Secondary | ICD-10-CM

## 2023-12-29 DIAGNOSIS — M5459 Other low back pain: Secondary | ICD-10-CM

## 2023-12-29 DIAGNOSIS — M542 Cervicalgia: Secondary | ICD-10-CM

## 2023-12-29 DIAGNOSIS — M25572 Pain in left ankle and joints of left foot: Secondary | ICD-10-CM
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
## 2023-12-31 ENCOUNTER — Other Ambulatory Visit: Payer: Self-pay | Admitting: Internal Medicine

## 2024-01-01 ENCOUNTER — Ambulatory Visit: Payer: 59 | Admitting: Physical Therapy

## 2024-01-07 ENCOUNTER — Encounter: Payer: 59 | Admitting: Physical Therapy

## 2024-01-12 ENCOUNTER — Other Ambulatory Visit: Payer: Self-pay | Admitting: Internal Medicine

## 2024-01-12 DIAGNOSIS — R569 Unspecified convulsions: Secondary | ICD-10-CM

## 2024-01-12 DIAGNOSIS — S134XXD Sprain of ligaments of cervical spine, subsequent encounter: Secondary | ICD-10-CM

## 2024-01-15 MED ORDER — HYDROCODONE-ACETAMINOPHEN 5-325 MG PO TABS: 1.0000 | ORAL_TABLET | Freq: Four times a day (QID) | ORAL | 0 refills | Status: DC | PRN

## 2024-01-18 ENCOUNTER — Encounter: Payer: Self-pay | Admitting: Internal Medicine

## 2024-01-18 ENCOUNTER — Ambulatory Visit (INDEPENDENT_AMBULATORY_CARE_PROVIDER_SITE_OTHER): Payer: 59 | Admitting: Internal Medicine

## 2024-01-18 VITALS — BP 122/80 | HR 72 | Temp 98.3°F | Ht 63.0 in

## 2024-01-18 DIAGNOSIS — S134XXD Sprain of ligaments of cervical spine, subsequent encounter: Secondary | ICD-10-CM

## 2024-01-18 DIAGNOSIS — S060X9D Concussion with loss of consciousness of unspecified duration, subsequent encounter: Secondary | ICD-10-CM

## 2024-01-18 DIAGNOSIS — R5383 Other fatigue: Secondary | ICD-10-CM | POA: Diagnosis not present

## 2024-01-18 DIAGNOSIS — S22008D Other fracture of unspecified thoracic vertebra, subsequent encounter for fracture with routine healing: Secondary | ICD-10-CM

## 2024-01-18 DIAGNOSIS — R569 Unspecified convulsions: Secondary | ICD-10-CM | POA: Diagnosis not present

## 2024-01-18 LAB — COMPREHENSIVE METABOLIC PANEL
ALT: 15 U/L (ref 0–35)
AST: 20 U/L (ref 0–37)
Albumin: 4.3 g/dL (ref 3.5–5.2)
Alkaline Phosphatase: 60 U/L (ref 39–117)
BUN: 9 mg/dL (ref 6–23)
CO2: 25 meq/L (ref 19–32)
Calcium: 9.7 mg/dL (ref 8.4–10.5)
Chloride: 98 meq/L (ref 96–112)
Creatinine, Ser: 0.8 mg/dL (ref 0.40–1.20)
GFR: 87.63 mL/min (ref >60.00)
Glucose, Bld: 88 mg/dL (ref 70–99)
Potassium: 3.7 meq/L (ref 3.5–5.1)
Sodium: 135 meq/L (ref 135–145)
Total Bilirubin: 0.5 mg/dL (ref 0.2–1.2)
Total Protein: 8.3 g/dL (ref 6.0–8.3)

## 2024-01-18 LAB — URINALYSIS, ROUTINE W REFLEX MICROSCOPIC
Ketones, ur: 40 — AB
Nitrite: NEGATIVE
Specific Gravity, Urine: 1.02 (ref 1.000–1.030)
Urine Glucose: NEGATIVE
Urobilinogen, UA: 0.2 (ref 0.0–1.0)
pH: 6 (ref 5.0–8.0)

## 2024-01-18 LAB — CBC WITH DIFFERENTIAL/PLATELET
Basophils Absolute: 0 10*3/uL (ref 0.0–0.1)
Basophils Relative: 0.2 % (ref 0.0–3.0)
Eosinophils Absolute: 0 10*3/uL (ref 0.0–0.7)
Eosinophils Relative: 0.4 % (ref 0.0–5.0)
HCT: 46.3 % — ABNORMAL HIGH (ref 36.0–46.0)
Hemoglobin: 15.5 g/dL — ABNORMAL HIGH (ref 12.0–15.0)
Lymphocytes Relative: 29.6 % (ref 12.0–46.0)
Lymphs Abs: 2.5 10*3/uL (ref 0.7–4.0)
MCHC: 33.4 g/dL (ref 30.0–36.0)
MCV: 93.9 fL (ref 78.0–100.0)
Monocytes Absolute: 0.6 10*3/uL (ref 0.1–1.0)
Monocytes Relative: 7 % (ref 3.0–12.0)
Neutro Abs: 5.3 10*3/uL (ref 1.4–7.7)
Neutrophils Relative %: 62.8 % (ref 43.0–77.0)
Platelets: 357 10*3/uL (ref 150.0–400.0)
RBC: 4.93 Mil/uL (ref 3.87–5.11)
RDW: 13 % (ref 11.5–15.5)
WBC: 8.4 10*3/uL (ref 4.0–10.5)

## 2024-01-18 LAB — VITAMIN D 25 HYDROXY (VIT D DEFICIENCY, FRACTURES): VITD: 35.04 ng/mL (ref 30.00–100.00)

## 2024-01-18 LAB — VITAMIN B12: Vitamin B-12: 553 pg/mL (ref 211–911)
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
## 2024-01-18 NOTE — Assessment & Plan Note (Signed)
 Seeing Dr Andy Bannister, NS Using a back brace CT: Mild T4-T7 superior endplate compression fractures and a mild compression fracture asymmetrically involving the left aspect of the T12 vertebral body. None of these are clearly acute, although the T12 fracture is new or progressive from the 2021 MRI and the T4-T7 fractures appear to be new from the 2012 radiographs.
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
## 2024-01-18 NOTE — Assessment & Plan Note (Signed)
 Probable Pt states she was taking Xanax , Zolpidem  infrequently EEG (-) Per [place]  DMV statutes, patients with seizures are not allowed to drive until they have been seizure-free for six months.  Neurology f/u

## 2024-01-18 NOTE — Assessment & Plan Note (Signed)
 Probable concussion. Better

## 2024-01-20 ENCOUNTER — Other Ambulatory Visit: Payer: Self-pay | Admitting: Internal Medicine

## 2024-01-20 ENCOUNTER — Encounter: Payer: Self-pay | Admitting: Internal Medicine

## 2024-01-20 MED ORDER — CEPHALEXIN 500 MG PO CAPS: 500.0000 mg | ORAL_CAPSULE | Freq: Three times a day (TID) | ORAL | 1 refills | Status: DC

## 2024-01-22 ENCOUNTER — Encounter: Payer: Self-pay | Admitting: Internal Medicine

## 2024-01-25 ENCOUNTER — Telehealth: Payer: Self-pay | Admitting: *Deleted

## 2024-01-25 NOTE — Telephone Encounter (Signed)
I called pt left message not able to reach pt.

## 2024-01-26 NOTE — Telephone Encounter (Signed)
Pt said have not received a call to schedule ophthalmology appointment. Gave patient the phone number to [facility]. Ask patient to call back if [facility] do not have the referral and will refax it.

## 2024-01-31 ENCOUNTER — Other Ambulatory Visit: Payer: Self-pay | Admitting: Internal Medicine

## 2024-01-31 DIAGNOSIS — R55 Syncope and collapse: Secondary | ICD-10-CM

## 2024-02-01 NOTE — Telephone Encounter (Signed)
 Pt called in-stated [facility] has not received referral for her, she called and they stated they have not gotten it. Please resend when available and give pt a call back, thank you

## 2024-02-04 NOTE — Telephone Encounter (Signed)
 Patient called in again regarding referral. [facility] has still not received referral for patient. Meghan [name] is working on resending this referral again. It looks like referral was sent to [facility], but they only have optometrists there and referral is for ophthalmology?

## 2024-02-04 NOTE — Telephone Encounter (Signed)
 Referral was refaxed to the [facility] location.

## 2024-02-09 NOTE — Telephone Encounter (Signed)
 Refax referral [facility] to [phone]

## 2024-02-09 NOTE — Telephone Encounter (Signed)
 Patient called in to advise that the [facility] in [place] is still stating that they have not received the referral from our office.   Spoke with Delice Bison at the [facility] - she tried to get me to someone at the front desk at the [place] location but no one was available. She sent a message over to them and will have one of them give me a call back.  In the meantime, confirmed their fax number with Delice Bison - [phone] -  will resend AGAIN.

## 2024-02-10 NOTE — Telephone Encounter (Signed)
 Received call back from patient, she did not have a preference as to what office she was referred to and did not mind being referred to an office in [place]. I went ahead and got her referred to [facility] and requested an appointment within 1 month.  [facility] [phone]  Called patient to make her aware of referral location.

## 2024-02-10 NOTE — Telephone Encounter (Signed)
 Phone room received a call back - [facility] did receive the referral but the are not an ophthalmology office. Unable to see patient for this dx. Called and LVM for pt to see where she would like referral sent

## 2024-02-16 ENCOUNTER — Telehealth: Payer: Self-pay | Admitting: Internal Medicine

## 2024-02-16 NOTE — Telephone Encounter (Signed)
 Copied from CRM [id number]. Topic: General - Other >> Feb 16, 2024  1:06 PM Theodis Sato wrote: Reason for CRM: Dr. Yisroel Ramming MD with [employer] disability insurance is requesting to speak with or schedule a time to talk with Dr. Posey Rea regarding this patients disability insurance claim. Please call Dr. Letitia Neri at [phone]

## 2024-02-22 NOTE — Telephone Encounter (Signed)
 Copied from CRM [id number]. Topic: General - Other >> Feb 22, 2024 11:48 AM Truddie Crumble wrote: Reason for CRM: lani from [employer] called checking on paper work that was faxed to the doctor on 3/12 and she has not received it back yet  CB [phone] Claim# [id number]

## 2024-02-22 NOTE — Telephone Encounter (Signed)
 I was able to speak with Enid Derry from [facility] and he has looked into the pts file.Enid Derry has stated they did receive all the pts paperwork that was faxed over to them both on 02/04/2024 and 02/05/2024. He has stated he will notate in the pts chart that the paperwork is there and on file.

## 2024-02-25 DIAGNOSIS — E538 Deficiency of other specified B group vitamins: Secondary | ICD-10-CM | POA: Insufficient documentation

## 2024-02-29 ENCOUNTER — Other Ambulatory Visit: Payer: Self-pay | Admitting: Internal Medicine

## 2024-02-29 DIAGNOSIS — R55 Syncope and collapse: Secondary | ICD-10-CM

## 2024-03-01 ENCOUNTER — Ambulatory Visit (HOSPITAL_BASED_OUTPATIENT_CLINIC_OR_DEPARTMENT_OTHER)
Admission: RE | Admit: 2024-03-01 | Discharge: 2024-03-01 | Disposition: A | Discharge status: Home / Self Care | Source: Ambulatory Visit | Attending: Internal Medicine | Admitting: Internal Medicine

## 2024-03-01 ENCOUNTER — Other Ambulatory Visit (HOSPITAL_COMMUNITY)

## 2024-03-01 DIAGNOSIS — R55 Syncope and collapse: Secondary | ICD-10-CM | POA: Insufficient documentation

## 2024-03-02 ENCOUNTER — Ambulatory Visit

## 2024-03-02 ENCOUNTER — Ambulatory Visit: Payer: 59

## 2024-03-02 VITALS — BP 124/82 | HR 58 | Ht 63.0 in | Wt 201.8 lb

## 2024-03-02 DIAGNOSIS — R55 Syncope and collapse: Secondary | ICD-10-CM | POA: Insufficient documentation

## 2024-03-02 DIAGNOSIS — E538 Deficiency of other specified B group vitamins: Secondary | ICD-10-CM

## 2024-03-02 LAB — ECHOCARDIOGRAM COMPLETE
AR max vel: 2.05 cm2
AV Area VTI: 1.87 cm2
AV Area mean vel: 1.79 cm2
AV Mean grad: 4 mmHg
AV Peak grad: 6.7 mmHg
Ao pk vel: 1.29 m/s
Area-P 1/2: 3.77 cm2
Calc EF: 64.3 %
MV M vel: 3.62 m/s
MV Peak grad: 52.4 mmHg
S' Lateral: 2.5 cm
Single Plane A2C EF: 60.3 %
Single Plane A4C EF: 67.7 %
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
## 2024-03-02 NOTE — Patient Instructions (Signed)
 Medication Instructions:  Your physician recommends that you continue on your current medications as directed. Please refer to the Current Medication list given to you today.  *If you need a refill on your cardiac medications before your next appointment, please call your pharmacy*   Lab Work: None Ordered If you have labs (blood work) drawn today and your tests are completely normal, you will receive your results only by: MyChart Message (if you have MyChart) OR A paper copy in the mail If you have any lab test that is abnormal or we need to change your treatment, we will call you to review the results.   Testing/Procedures: A zio monitor was ordered today. It will remain on for 28 days. Remove 03/30/2024. You will then return monitor and event diary in provided box. It takes 1-2 weeks for report to be downloaded and returned to [place]. We will call you with the results. If monitor falls off or has orange flashing light, please call [employer]io for further instructions.     Follow-Up: At [facility], you and your health needs are our priority.  As part of our continuing mission to provide you with exceptional heart care, we have created designated Provider Care Teams.  These Care Teams include your primary Cardiologist (physician) and Advanced Practice Providers (APPs -  Physician Assistants and Nurse Practitioners) who all work together to provide you with the care you need, when you need it.  We recommend signing up for the patient portal called "MyChart".  Sign up information is provided on this After Visit Summary.  MyChart is used to connect with patients for Virtual Visits (Telemedicine).  Patients are able to view lab/test results, encounter notes, upcoming appointments, etc.  Non-urgent messages can be sent to your provider as well.   To learn more about what you can do with MyChart, go to [email or web address].    Your next appointment:   Based on test results
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
## 2024-03-04 ENCOUNTER — Encounter: Payer: Self-pay | Admitting: Internal Medicine

## 2024-03-10 ENCOUNTER — Encounter: Payer: Self-pay | Admitting: Internal Medicine

## 2024-05-05 ENCOUNTER — Ambulatory Visit (INDEPENDENT_AMBULATORY_CARE_PROVIDER_SITE_OTHER): Payer: Self-pay | Admitting: Internal Medicine

## 2024-05-05 ENCOUNTER — Encounter: Payer: Self-pay | Admitting: Internal Medicine

## 2024-05-05 VITALS — BP 110/72 | HR 68 | Temp 99.1°F | Ht 63.0 in | Wt 199.0 lb

## 2024-05-05 DIAGNOSIS — R55 Syncope and collapse: Secondary | ICD-10-CM | POA: Diagnosis not present

## 2024-05-05 DIAGNOSIS — R569 Unspecified convulsions: Secondary | ICD-10-CM

## 2024-05-05 DIAGNOSIS — S060X9D Concussion with loss of consciousness of unspecified duration, subsequent encounter: Secondary | ICD-10-CM

## 2024-05-05 DIAGNOSIS — S22008D Other fracture of unspecified thoracic vertebra, subsequent encounter for fracture with routine healing: Secondary | ICD-10-CM

## 2024-05-05 DIAGNOSIS — E538 Deficiency of other specified B group vitamins: Secondary | ICD-10-CM

## 2024-05-05 NOTE — Assessment & Plan Note (Signed)
 On B12

## 2024-05-05 NOTE — Progress Notes (Signed)
 Subjective:  Patient ID: Meghan [name], female    DOB: 1976/09/24  Age: 48 y.o. MRN: [id number]  CC: Medical Management of Chronic Issues (Pt is here for work release and for [facility] paperwork to be filled out to be cleared for driving)   HPI Meghan [name] presents for syncope Starting a new job  Outpatient Medications Prior to Visit  Medication Sig Dispense Refill   Acetaminophen  (TYLENOL  PO) Take 2 tablets by mouth daily as needed (headache, pain).     alprazolam  (XANAX ) 2 MG tablet Take 2 mg by mouth daily as needed for anxiety or sleep.     Cholecalciferol (VITAMIN D3) 50 MCG (2000 UT) capsule Take 1 capsule (2,000 Units total) by mouth daily. 100 capsule 3   Cyanocobalamin  (VITAMIN B-12) 1000 MCG SUBL Place 1 tablet (1,000 mcg total) under the tongue daily. 100 tablet 3   estradiol (ESTRACE) 0.1 MG/GM vaginal cream Place 1 Applicatorful vaginally 3 (three) times a week.     Ferrous Sulfate (IRON PO) Take 1 tablet by mouth daily as needed (fatigue, anemia).     Magnesium 200 MG TABS      zolpidem  (AMBIEN ) 10 MG tablet Take 10 mg by mouth at bedtime.     celecoxib  (CELEBREX ) 100 MG capsule      HYDROcodone -acetaminophen  (NORCO/VICODIN) 5-325 MG tablet Take 1 tablet by mouth every 6 (six) hours as needed for severe pain (pain score 7-10). 60 tablet 0   methocarbamol  (ROBAXIN ) 500 MG tablet Take 1 tablet (500 mg total) by mouth every 8 (eight) hours as needed for muscle spasms (spasms). 30 tablet 1   traMADol  (ULTRAM ) 50 MG tablet Take 50 mg by mouth every 6 (six) hours as needed.     Vitamin D , Ergocalciferol , (DRISDOL ) 1.25 MG (50000 UNIT) CAPS capsule TAKE 1 CAPSULE (50,000 UNITS TOTAL) BY MOUTH EVERY 7 (SEVEN) DAYS 12 capsule 1   No facility-administered medications prior to visit.    ROS: Review of Systems  Constitutional:  Positive for fatigue. Negative for activity change, appetite change, chills and unexpected weight change.  HENT:  Negative for congestion, mouth sores and  sinus pressure.   Eyes:  Negative for visual disturbance.  Respiratory:  Negative for cough and chest tightness.   Gastrointestinal:  Negative for abdominal pain and nausea.  Genitourinary:  Negative for difficulty urinating, frequency and vaginal pain.  Musculoskeletal:  Positive for back pain. Negative for gait problem.  Skin:  Negative for pallor and rash.  Neurological:  Negative for dizziness, tremors, weakness, numbness and headaches.  Psychiatric/Behavioral:  Negative for confusion and sleep disturbance.     Objective:  BP 110/72   Pulse 68   Temp 99.1 F (37.3 C) (Oral)   Ht 5\' 3"  (1.6 m)   Wt 199 lb (90.3 kg)   LMP 01/28/2016   SpO2 99%   BMI 35.25 kg/m   BP Readings from Last 3 Encounters:  05/05/24 110/72  03/02/24 124/82  01/18/24 122/80    Wt Readings from Last 3 Encounters:  05/05/24 199 lb (90.3 kg)  03/02/24 201 lb 12.8 oz (91.5 kg)  12/15/23 199 lb 9.6 oz (90.5 kg)    Physical Exam Constitutional:      General: She is not in acute distress.    Appearance: She is well-developed.  HENT:     Head: Normocephalic.     Right Ear: External ear normal.     Left Ear: External ear normal.     Nose: Nose normal.  Eyes:     General:        Right eye: No discharge.        Left eye: No discharge.     Conjunctiva/sclera: Conjunctivae normal.     Pupils: Pupils are equal, round, and reactive to light.  Neck:     Thyroid : No thyromegaly.     Vascular: No JVD.     Trachea: No tracheal deviation.  Cardiovascular:     Rate and Rhythm: Normal rate and regular rhythm.     Heart sounds: Normal heart sounds.  Pulmonary:     Effort: No respiratory distress.     Breath sounds: No stridor. No wheezing.  Abdominal:     General: Bowel sounds are normal. There is no distension.     Palpations: Abdomen is soft. There is no mass.     Tenderness: There is no abdominal tenderness. There is no guarding or rebound.  Musculoskeletal:        General: No tenderness.      Cervical back: Normal range of motion and neck supple. No rigidity.     Right lower leg: No edema.     Left lower leg: No edema.  Lymphadenopathy:     Cervical: No cervical adenopathy.  Skin:    Findings: No erythema or rash.  Neurological:     Cranial Nerves: No cranial nerve deficit.     Motor: No abnormal muscle tone.     Coordination: Coordination normal.     Deep Tendon Reflexes: Reflexes normal.  Psychiatric:        Behavior: Behavior normal.        Thought Content: Thought content normal.        Judgment: Judgment normal.     Lab Results  Component Value Date   WBC 8.4 01/18/2024   HGB 15.5 (H) 01/18/2024   HCT 46.3 (H) 01/18/2024   PLT 357.0 01/18/2024   GLUCOSE 88 01/18/2024   CHOL 180 12/03/2023   TRIG 79.0 12/03/2023   HDL 53.00 12/03/2023   LDLCALC 112 (H) 12/03/2023   ALT 15 01/18/2024   AST 20 01/18/2024   NA 135 01/18/2024   K 3.7 01/18/2024   CL 98 01/18/2024   CREATININE 0.80 01/18/2024   BUN 9 01/18/2024   CO2 25 01/18/2024   TSH 0.68 12/03/2023   HGBA1C 5.8 12/03/2023    ECHOCARDIOGRAM COMPLETE Result Date: 03/02/2024    ECHOCARDIOGRAM REPORT   Patient Name:   Meghan [name] Date of Exam: 03/01/2024 Medical Rec #:  [id number]      Height:       63.0 in Accession #:    [id number]     Weight:       199.6 lb Date of Birth:  03/20/1976       BSA:          1.932 m Patient Age:    47 years       BP:           122/80 mmHg Patient Gender: F              HR:           57 bpm. Exam Location:  [place] Procedure: 2D Echo, Cardiac Doppler and Color Doppler (Both Spectral and Color            Flow Doppler were utilized during procedure). Indications:    R55 Syncope  History:        Patient has no prior history of  Echocardiogram examinations.                 Anemia, Signs/Symptoms:Syncope; Risk Factors:Non-Smoker.  Sonographer:    Lyndal Sandy RDMS, RVT, RDCS Referring Phys: [id number] Adreena Willits V Kaelan Amble IMPRESSIONS  1. Left ventricular ejection fraction, by estimation,  is 60 to 65%. The left ventricle has normal function. The left ventricle has no regional wall motion abnormalities. Left ventricular diastolic parameters were normal.  2. Right ventricular systolic function is normal. The right ventricular size is normal.  3. The mitral valve is normal in structure. No evidence of mitral valve regurgitation. No evidence of mitral stenosis.  4. The aortic valve is normal in structure. Aortic valve regurgitation is not visualized. No aortic stenosis is present.  5. The inferior vena cava is normal in size with greater than 50% respiratory variability, suggesting right atrial pressure of 3 mmHg. FINDINGS  Left Ventricle: Left ventricular ejection fraction, by estimation, is 60 to 65%. The left ventricle has normal function. The left ventricle has no regional wall motion abnormalities. The left ventricular internal cavity size was normal in size. There is  no left ventricular hypertrophy. Left ventricular diastolic parameters were normal. Right Ventricle: The right ventricular size is normal. No increase in right ventricular wall thickness. Right ventricular systolic function is normal. Left Atrium: Left atrial size was normal in size. Right Atrium: Right atrial size was normal in size. Pericardium: There is no evidence of pericardial effusion. Mitral Valve: The mitral valve is normal in structure. No evidence of mitral valve regurgitation. No evidence of mitral valve stenosis. Tricuspid Valve: The tricuspid valve is normal in structure. Tricuspid valve regurgitation is not demonstrated. No evidence of tricuspid stenosis. Aortic Valve: The aortic valve is normal in structure. Aortic valve regurgitation is not visualized. No aortic stenosis is present. Aortic valve mean gradient measures 4.0 mmHg. Aortic valve peak gradient measures 6.7 mmHg. Aortic valve area, by VTI measures 1.87 cm. Pulmonic Valve: The pulmonic valve was normal in structure. Pulmonic valve regurgitation is not  visualized. No evidence of pulmonic stenosis. Aorta: The aortic root is normal in size and structure. Venous: The inferior vena cava is normal in size with greater than 50% respiratory variability, suggesting right atrial pressure of 3 mmHg. IAS/Shunts: No atrial level shunt detected by color flow Doppler.  LEFT VENTRICLE PLAX 2D LVIDd:         3.60 cm     Diastology LVIDs:         2.50 cm     LV e' medial:    9.14 cm/s LV PW:         1.00 cm     LV E/e' medial:  13.1 LV IVS:        0.80 cm     LV e' lateral:   11.30 cm/s LVOT diam:     1.80 cm     LV E/e' lateral: 10.6 LV SV:         63 LV SV Index:   33 LVOT Area:     2.54 cm  LV Volumes (MOD) LV vol d, MOD A2C: 76.1 ml LV vol d, MOD A4C: 67.1 ml LV vol s, MOD A2C: 30.2 ml LV vol s, MOD A4C: 21.7 ml LV SV MOD A2C:     45.9 ml LV SV MOD A4C:     67.1 ml LV SV MOD BP:      46.1 ml RIGHT VENTRICLE RV S prime:     12.40 cm/s TAPSE (M-mode): 2.0  cm LEFT ATRIUM             Index        RIGHT ATRIUM           Index LA diam:        3.50 cm 1.81 cm/m   RA Area:     10.50 cm LA Vol (A2C):   40.5 ml 20.96 ml/m  RA Volume:   23.40 ml  12.11 ml/m LA Vol (A4C):   37.0 ml 19.15 ml/m LA Biplane Vol: 40.3 ml 20.86 ml/m  AORTIC VALVE AV Area (Vmax):    2.05 cm AV Area (Vmean):   1.79 cm AV Area (VTI):     1.87 cm AV Vmax:           129.00 cm/s AV Vmean:          97.100 cm/s AV VTI:            0.336 m AV Peak Grad:      6.7 mmHg AV Mean Grad:      4.0 mmHg LVOT Vmax:         104.00 cm/s LVOT Vmean:        68.300 cm/s LVOT VTI:          0.247 m LVOT/AV VTI ratio: 0.74  AORTA Ao Root diam: 3.00 cm Ao Asc diam:  2.60 cm MITRAL VALVE                TRICUSPID VALVE MV Area (PHT): 3.77 cm     TR Peak grad:   17.0 mmHg MV Decel Time: 201 msec     TR Vmax:        206.00 cm/s MR Peak grad: 52.4 mmHg MR Vmax:      362.00 cm/s   SHUNTS MV E velocity: 120.00 cm/s  Systemic VTI:  0.25 m MV A velocity: 53.90 cm/s   Systemic Diam: 1.80 cm MV E/A ratio:  2.23 Hillis Lu MD  Electronically signed by Hillis Lu MD Signature Date/Time: 03/02/2024/5:17:08 PM    Final     Assessment & Plan:   Problem List Items Addressed This Visit     Seizure (HCC)   Pt states she was taking Xanax , Zolpidem  infrequently EEG (-) Per [place]  DMV statutes, patients with seizures are not allowed to drive until they have been seizure-free for six months. [place]  Neurology       Relevant Medications   zolpidem  (AMBIEN ) 10 MG tablet   Concussion - Primary   Doing well      Thoracic vertebral fracture (HCC)   Occasional pain.  Overall doing well.      B12 deficiency   On B12      Syncope and collapse   (-) w/up so far No relapse OK to drive         No orders of the defined types were placed in this encounter.     Follow-up: Return in about 6 months (around 11/05/2024) for a follow-up visit.  Anitra Barn, MD

## 2024-05-05 NOTE — Assessment & Plan Note (Signed)
 Doing well

## 2024-05-05 NOTE — Assessment & Plan Note (Signed)
 Pt states she was taking Xanax , Zolpidem  infrequently EEG (-) Per [place]  DMV statutes, patients with seizures are not allowed to drive until they have been seizure-free for six months. [facility]  Neurology

## 2024-05-05 NOTE — Assessment & Plan Note (Addendum)
(-)   w/up so far No relapse OK to drive

## 2024-05-09 NOTE — Assessment & Plan Note (Signed)
 Occasional pain.  Overall doing well.

## 2024-05-18 ENCOUNTER — Telehealth: Payer: Self-pay

## 2024-05-18 NOTE — Telephone Encounter (Signed)
 I was able to LVM for pt that her paperwork has placed up front is ready for pick up.

## 2024-05-25 ENCOUNTER — Encounter: Payer: Self-pay | Admitting: Internal Medicine

## 2024-06-06 ENCOUNTER — Telehealth: Payer: Self-pay | Admitting: Diagnostic Neuroimaging

## 2024-06-06 NOTE — Telephone Encounter (Signed)
 LVM and sent mychart msg informing pt of need to reschedule 06/13/24 appt - MD out

## 2024-06-13 ENCOUNTER — Telehealth: Payer: 59 | Admitting: Diagnostic Neuroimaging

## 2024-06-30 NOTE — Telephone Encounter (Signed)
 Reason for CRM: Patient would like to tell nurse Hadassah that she will be coming to pick up  dmv forms would like it waiting in the front office for her

## 2024-11-26 ENCOUNTER — Other Ambulatory Visit (HOSPITAL_BASED_OUTPATIENT_CLINIC_OR_DEPARTMENT_OTHER): Payer: Self-pay
